# Patient Record
Sex: Male | Born: 1990 | Race: White | Hispanic: No | Marital: Single | Smoking: Former smoker
Health system: Southern US, Community
[De-identification: ages and names within clinical notes are randomized; demographics above are authoritative.]

## PROBLEM LIST (undated history)

## (undated) DIAGNOSIS — L309 Dermatitis, unspecified: Secondary | ICD-10-CM

## (undated) DIAGNOSIS — J45909 Unspecified asthma, uncomplicated: Secondary | ICD-10-CM

## (undated) HISTORY — DX: Unspecified asthma, uncomplicated: J45.909

## (undated) HISTORY — DX: Dermatitis, unspecified: L30.9

---
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
## 2015-03-27 DIAGNOSIS — L209 Atopic dermatitis, unspecified: Secondary | ICD-10-CM

## 2015-03-27 DIAGNOSIS — J4540 Moderate persistent asthma, uncomplicated: Secondary | ICD-10-CM | POA: Insufficient documentation

## 2015-04-14 ENCOUNTER — Ambulatory Visit: Payer: Self-pay | Admitting: Allergy and Immunology

## 2015-05-11 ENCOUNTER — Encounter: Payer: Self-pay | Admitting: Allergy and Immunology

## 2015-05-11 ENCOUNTER — Ambulatory Visit (INDEPENDENT_AMBULATORY_CARE_PROVIDER_SITE_OTHER): Payer: BLUE CROSS/BLUE SHIELD | Admitting: Allergy and Immunology

## 2015-05-11 VITALS — BP 112/74 | HR 76 | Temp 97.5°F | Resp 20

## 2015-05-11 DIAGNOSIS — J3089 Other allergic rhinitis: Secondary | ICD-10-CM | POA: Diagnosis not present

## 2015-05-11 DIAGNOSIS — L209 Atopic dermatitis, unspecified: Secondary | ICD-10-CM

## 2015-05-11 DIAGNOSIS — J4540 Moderate persistent asthma, uncomplicated: Secondary | ICD-10-CM | POA: Diagnosis not present

## 2015-05-11 DIAGNOSIS — T7800XA Anaphylactic reaction due to unspecified food, initial encounter: Secondary | ICD-10-CM | POA: Insufficient documentation

## 2015-05-11 DIAGNOSIS — T7800XD Anaphylactic reaction due to unspecified food, subsequent encounter: Secondary | ICD-10-CM | POA: Diagnosis not present

## 2015-05-11 MED ORDER — MOMETASONE FURO-FORMOTEROL FUM 100-5 MCG/ACT IN AERO: 2.0000 | INHALATION_SPRAY | Freq: Two times a day (BID) | RESPIRATORY_TRACT | Status: DC

## 2015-05-11 MED ORDER — LEVOCETIRIZINE DIHYDROCHLORIDE 5 MG PO TABS: 5.0000 mg | ORAL_TABLET | Freq: Every evening | ORAL | Status: DC

## 2015-05-11 NOTE — Patient Instructions (Addendum)
Moderate persistent asthma Well-controlled.  We will stepdown therapy at this time.  [facility]asone/[facility]oterol) 100/5 g, 2 inhalations via spacer device twice a day.  Discontinue Dulera 200/5 g.  Continue ProAir Respiclick every 4-6 hours as needed and 15 minutes prior to vigorous exercise.  Subjective and objective measures of pulmonary function will be followed and the treatment plan will be adjusted accordingly.   Food allergy  Continue meticulous avoidance of tree nuts and have access to epinephrine autoinjector 2 pack.  Atopic dermatitis  Continue appropriate skin care and halobetasol 0.05% or mometasone 0.1% cream sparingly to affected areas as needed.  Allergic rhinitis  Aeroallergen avoidance measures have been discussed and provided in written form.  A prescription has been provided for levocetirizine, 5 mg daily as needed.  For now, continue fluticasone nasal spray as needed.  The risks and benefits of aeroallergen immunotherapy have been discussed. The patient is motivated to initiate immunotherapy if insurance coverage is favorable. He will let us know how he would like to proceed.     Return in about 3 months (around 08/11/2015), or if symptoms worsen or fail to improve.

## 2015-05-11 NOTE — Assessment & Plan Note (Signed)
Well-controlled.  We will stepdown therapy at this time.  [facility] 100/5 g, 2 inhalations via spacer device twice a day.  Discontinue Dulera 200/5 g.  Continue ProAir Respiclick every 4-6 hours as needed and 15 minutes prior to vigorous exercise.  Subjective and objective measures of pulmonary function will be followed and the treatment plan will be adjusted accordingly.

## 2015-05-11 NOTE — Assessment & Plan Note (Signed)
   Continue meticulous avoidance of tree nuts and have access to epinephrine autoinjector 2 pack.

## 2015-05-11 NOTE — Progress Notes (Signed)
History of present illness: HPI Comments: Jon Daniel is a 24 y.o. male with asthma, allergic rhinitis, atopic dermatitis, and food allergies presents today for follow up and allergy skin testing.  He was unable to undergo skin testing during his previous visit due to an asthma exacerbation.  Please see history of present illness from 03/16/2015 for details. Jon Daniel reports that in the interval since his previous visit in August, his asthma symptoms have improved significantly and are well controlled with Dulera 200/5, 2 inhalations via spacer device twice a day.  He has only required albuterol rescue on one occasion over the past 6 weeks and denies nocturnal awakenings due to lower respiratory symptoms.  He has no specific rhinitis or eczema related complaints today.  He avoids tree nuts and has access to epinephrine autoinjector 2 pack.   Assessment and plan: Moderate persistent asthma Well-controlled.  We will stepdown therapy at this time.  [facility]ol[facility] 100/5 g, 2 inhalations via spacer device twice a day.  Discontinue Dulera 200/5 g.  Continue ProAir Respiclick every 4-6 hours as needed and 15 minutes prior to vigorous exercise.  Subjective and objective measures of pulmonary function will be followed and the treatment plan will be adjusted accordingly.   Food allergy  Continue meticulous avoidance of tree nuts and have access to epinephrine autoinjector 2 pack.  Atopic dermatitis  Continue appropriate skin care and halobetasol 0.05% or mometasone 0.1% cream sparingly to affected areas as needed.  Allergic rhinitis  Aeroallergen avoidance measures have been discussed and provided in written form.  A prescription has been provided for levocetirizine, 5 mg daily as needed.  For now, continue fluticasone nasal spray as needed.  The risks and benefits of aeroallergen immunotherapy have been discussed. The patient is  motivated to initiate immunotherapy if insurance coverage is favorable. He will let us know how he would like to proceed.     Medications ordered this encounter: Meds ordered this encounter  Medications  . mometasone-formoterol (DULERA) 100-5 MCG/ACT AERO    Sig: Inhale 2 puffs into the lungs 2 (two) times daily.    Dispense:  1 Inhaler    Refill:  5  . levocetirizine (XYZAL) 5 MG tablet    Sig: Take 1 tablet (5 mg total) by mouth every evening.    Dispense:  30 tablet    Refill:  5    Diagnositics: Percutaneous Skin Testing: Positive for grass pollen, weed pollen, tree pollen, mold, cat hair, force epithelia, and dust mite antigen. Intradermal Skin Testing: Positive to ragweed and dog epithelia. Spirometry: normal. Please see scanned spirometry results for details.     Physical examination: Blood pressure 112/74, pulse 76, temperature 97.5 F (36.4 C), temperature source Oral, resp. rate 20.  General: Alert, interactive, in no acute distress. HEENT: TMs pearly gray, turbinates markedly edematous with clear discharge, post-pharynx moderately erythematous. Neck: Supple without lymphadenopathy. Lungs: Clear to auscultation without wheezing, rhonchi or rales. CV: Normal S1, S2 without murmurs. Skin: Warm and dry, without lesions or rashes.  The following portions of the patient's history were reviewed and updated as appropriate: allergies, current medications, past family history, past medical history, past social history, past surgical history and problem list.  Outpatient medications:   Medication List       This list is accurate as of: 05/11/15  4:24 PM.  Always use your most recent med list.  BENADRYL ALLERGY PO  Take by mouth as needed.     EPIPEN 2-PAK 0.3 mg/0.3 mL Soaj injection  Generic drug:  EPINEPHrine  Inject 0.3 mg into the muscle once.     fexofenadine 180 MG tablet  Commonly known as:  ALLEGRA  Take 180 mg by mouth daily.      fluticasone 50 MCG/ACT nasal spray  Commonly known as:  FLONASE  Place 1 spray into both nostrils daily.     halobetasol 0.05 % cream  Commonly known as:  ULTRAVATE  Apply topically 2 (two) times daily.     hydrOXYzine 10 MG tablet  Commonly known as:  ATARAX/VISTARIL  Take 10 mg by mouth daily as needed.     levocetirizine 5 MG tablet  Commonly known as:  XYZAL  Take 1 tablet (5 mg total) by mouth every evening.     mometasone 0.1 % cream  Commonly known as:  ELOCON  Apply 1 application topically as needed.     mometasone-formoterol 100-5 MCG/ACT Aero  Commonly known as:  DULERA  Inhale 2 puffs into the lungs 2 (two) times daily.     MOTRIN PO  Take by mouth as needed.     PROAIR RESPICLICK 108 (90 BASE) MCG/ACT Aepb  Generic drug:  Albuterol Sulfate  Inhale 2 puffs into the lungs.        Known medication allergies: No Known Allergies  I appreciate the opportunity to take part in this Jon Daniel's care. Please do not hesitate to contact me with questions.  Sincerely,   R. Jorene Guest, MD

## 2015-05-11 NOTE — Assessment & Plan Note (Addendum)
   Continue appropriate skin care and halobetasol 0.05% or mometasone 0.1% cream sparingly to affected areas as needed.

## 2015-05-11 NOTE — Assessment & Plan Note (Addendum)
   Aeroallergen avoidance measures have been discussed and provided in written form.  A prescription has been provided for levocetirizine, 5 mg daily as needed.  For now, continue fluticasone nasal spray as needed.  The risks and benefits of aeroallergen immunotherapy have been discussed. The patient is motivated to initiate immunotherapy if insurance coverage is favorable. He will let us know how he would like to proceed.

## 2015-08-11 ENCOUNTER — Telehealth: Payer: Self-pay | Admitting: Allergy and Immunology

## 2015-08-11 ENCOUNTER — Ambulatory Visit: Payer: BLUE CROSS/BLUE SHIELD | Admitting: Allergy and Immunology

## 2015-08-11 NOTE — Telephone Encounter (Signed)
Pt's mother called today to cancel apt. She said Jon Daniel has moved to [facility] [place] and was wondering if we could Refer him to an Allergist there. pls advise

## 2015-08-11 NOTE — Telephone Encounter (Signed)
Called and spoke to patients mother and advised to contact there insurance and find out what allergist are covered in [place] then she could contact us back and we could make the referral.

## 2015-09-12 ENCOUNTER — Other Ambulatory Visit: Payer: Self-pay | Admitting: Allergy and Immunology

## 2016-04-03 ENCOUNTER — Other Ambulatory Visit: Payer: Self-pay

## 2016-04-03 DIAGNOSIS — J4540 Moderate persistent asthma, uncomplicated: Secondary | ICD-10-CM

## 2016-04-04 ENCOUNTER — Other Ambulatory Visit: Payer: Self-pay

## 2016-04-04 ENCOUNTER — Telehealth: Payer: Self-pay | Admitting: Allergy and Immunology

## 2016-04-04 DIAGNOSIS — J4540 Moderate persistent asthma, uncomplicated: Secondary | ICD-10-CM

## 2016-04-04 NOTE — Telephone Encounter (Signed)
Tried to call mom to get address of the pharmacy but their is no voicemail.

## 2016-04-04 NOTE — Telephone Encounter (Signed)
Yes, you may provide a refill of Dulera and ProAir respiclick. Thanks.

## 2016-04-04 NOTE — Telephone Encounter (Signed)
Patient's mom called and made an appt on 04/17/16, for her son, Jon Daniel [birth year] with Dr. Delorse LekPadgett. He was last seen by Dr. Nunzio CobbsBobbitt on 05/11/15. He has since moved to [facility] [place] and is going to make a trip up here for his appt. He needs his inhaler's refilled. I looked them up and see he has G[employer]erro Air Respiclick and Dulera. His pharmacy is in [place]. [facility] [address] [phone].

## 2016-04-04 NOTE — Telephone Encounter (Signed)
[facility] and this is another number for a difference [facility] in [place] [phone]. [phone] mom cell. [phone] home.

## 2016-04-04 NOTE — Telephone Encounter (Signed)
Pt hasnt been seen since 05/11/2015 is it ok to refill this medication for the patient?  Please advise

## 2016-04-05 ENCOUNTER — Telehealth: Payer: Self-pay | Admitting: Allergy and Immunology

## 2016-04-05 ENCOUNTER — Other Ambulatory Visit: Payer: Self-pay

## 2016-04-05 DIAGNOSIS — J4540 Moderate persistent asthma, uncomplicated: Secondary | ICD-10-CM

## 2016-04-05 MED ORDER — MOMETASONE FURO-FORMOTEROL FUM 100-5 MCG/ACT IN AERO: 2.0000 | INHALATION_SPRAY | Freq: Two times a day (BID) | RESPIRATORY_TRACT | 0 refills | Status: DC

## 2016-04-05 MED ORDER — ALBUTEROL SULFATE 108 (90 BASE) MCG/ACT IN AEPB: 2.0000 | INHALATION_SPRAY | RESPIRATORY_TRACT | 0 refills | Status: DC | PRN

## 2016-04-05 NOTE — Telephone Encounter (Signed)
Patient's father called checking on the refill request from yesterday for his son's inhaler. He wants to know if it will be able to be filled at all? If it is not able to be filled, he would like to be called back today so he can tell his son to go to a walk in clinic if not.

## 2016-04-05 NOTE — Telephone Encounter (Signed)
Spoke with dad and sent in rx to correct pharmacy 

## 2016-04-05 NOTE — Telephone Encounter (Signed)
Spoke with dad and sent in rx to correct pharmacy

## 2016-04-17 ENCOUNTER — Ambulatory Visit (INDEPENDENT_AMBULATORY_CARE_PROVIDER_SITE_OTHER): Payer: BLUE CROSS/BLUE SHIELD | Admitting: Allergy

## 2016-04-17 ENCOUNTER — Encounter: Payer: Self-pay | Admitting: Allergy

## 2016-04-17 ENCOUNTER — Encounter (INDEPENDENT_AMBULATORY_CARE_PROVIDER_SITE_OTHER): Payer: Self-pay

## 2016-04-17 VITALS — BP 132/90 | HR 78 | Temp 97.9°F | Resp 18 | Wt 204.4 lb

## 2016-04-17 DIAGNOSIS — L2089 Other atopic dermatitis: Secondary | ICD-10-CM | POA: Diagnosis not present

## 2016-04-17 DIAGNOSIS — J4540 Moderate persistent asthma, uncomplicated: Secondary | ICD-10-CM

## 2016-04-17 DIAGNOSIS — J301 Allergic rhinitis due to pollen: Secondary | ICD-10-CM

## 2016-04-17 DIAGNOSIS — T7800XD Anaphylactic reaction due to unspecified food, subsequent encounter: Secondary | ICD-10-CM | POA: Diagnosis not present

## 2016-04-17 MED ORDER — HALOBETASOL PROPIONATE 0.05 % EX CREA: TOPICAL_CREAM | CUTANEOUS | 3 refills | Status: DC | PRN

## 2016-04-17 MED ORDER — MOMETASONE FURO-FORMOTEROL FUM 100-5 MCG/ACT IN AERO: 2.0000 | INHALATION_SPRAY | Freq: Two times a day (BID) | RESPIRATORY_TRACT | 5 refills | Status: DC

## 2016-04-17 MED ORDER — MOMETASONE FUROATE 0.1 % EX CREA: 1.0000 "application " | TOPICAL_CREAM | CUTANEOUS | 3 refills | Status: DC | PRN

## 2016-04-17 MED ORDER — FLUTICASONE PROPIONATE 50 MCG/ACT NA SUSP: 2.0000 | Freq: Every day | NASAL | 5 refills | Status: DC

## 2016-04-17 MED ORDER — ALBUTEROL SULFATE 108 (90 BASE) MCG/ACT IN AEPB: 2.0000 | INHALATION_SPRAY | RESPIRATORY_TRACT | 1 refills | Status: DC | PRN

## 2016-04-17 NOTE — Progress Notes (Signed)
Follow-up Note  RE: Jon Daniel MRN: [id number] DOB: 30-Aug-1990 Date of Office Visit: 04/17/2016   History of present illness: Jon Daniel is a 25 y.o. male presenting today for follow-up of asthma, food allergy, eczema and allergic rhinitis.  He was last seen in our office in October 2016 by Dr. Nunzio CobbsBobbitt.  He ran out of his medications several weeks ago intake comments he get refill until his upcoming apointment.  He has been back on his medications for about a week.  He noticed a big difference in his breathing when he ran [facility].He noted that he was needing to use his albuterol almost daily. Since being back on his [facility] SleightDulera he has not needed to use his albuterol. Prior to running out of his inhalers he reports using albuterol maybe 3-4 mornings over the past year for rescue symptoms. He denies any exacerbations requiring eat or urgent care visits, oral steroids or hospitalization. He denies any nighttime awakenings.   He will use allegra as needed.  He has been having more nasal drainage and congestion.  He has Flonase that he uses as needed but has not been using it lately with his nasal symptoms.Marland Kitchen. He feels the changes in the weather has brought on his nasal symptoms.   With his food allergy continues to avoid tree nuts.  Has uptodate epipen. He has not had any accidental ingestions    With his eczema he needs refills of his Elocon and Ultravate.  His problem areas are elbow crease and decrease.He does not moisturize daily basis.  Review of systems: Review of Systems  Constitutional: Negative for chills and fever.  HENT: Positive for congestion. Negative for sore throat.   Eyes: Negative for redness.  Respiratory: Positive for cough and wheezing.   Cardiovascular: Negative for chest pain.  Gastrointestinal: Negative for nausea and vomiting.  Skin: Positive for itching and rash.  Neurological: Negative for headaches.    All other systems negative unless noted above  in HPI  Past medical/social/surgical/family history have been reviewed and are unchanged unless specifically indicated below.  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  Medication List:   Medication List       Accurate as of 04/17/16 12:38 PM. Always use your most recent med list.          BENADRYL ALLERGY PO Take by mouth as needed.   EPIPEN 2-PAK 0.3 mg/0.3 mL Soaj injection Generic drug:  EPINEPHrine Inject 0.3 mg into the muscle once.   fexofenadine 180 MG tablet Commonly known as:  ALLEGRA Take 180 mg by mouth daily.   halobetasol 0.05 % cream Commonly known as:  ULTRAVATE Apply topically 2 (two) times daily.   hydrOXYzine 10 MG tablet Commonly known as:  ATARAX/VISTARIL Take 10 mg by mouth daily as needed.   levocetirizine 5 MG tablet Commonly known as:  XYZAL Take 1 tablet (5 mg total) by mouth every evening.   mometasone 0.1 % cream Commonly known as:  ELOCON Apply 1 application topically as needed.   MOTRIN PO Take by mouth as needed.       Known medication allergies: No Known Allergies   Physical examination: Blood pressure 132/90, pulse 78, temperature 97.9 F (36.6 C), temperature source Oral, resp. rate 18, weight 204 lb 6.4 oz (92.7 kg), SpO2 95 %.  General: Alert, interactive, in no acute distress. HEENT: TMs pearly gray, turbinates moderately edematous with clear discharge, post-pharynx  non erythematous. Neck: Supple without lymphadenopathy. Lungs: Clear to auscultation without wheezing, rhonchi or rales. {no increased work of breathing. CV: Normal S1, S2 without murmurs. Abdomen: Nondistended, nontender. Skin: Dry, erythematous, excoriated patches on the Antecubital fossa and popliteal fossa bilaterally. Extremities:  No clubbing, cyanosis or edema. Neuro:   Grossly intact.  Diagnositics/Labs: Spirometry: FEV1: 3.58L  81%, FVC: 6.11L  119%, ratio consistent with Obstructive pattern  ACT  score 20  Assessment and plan:   Asthma, moderate persistent  Refill Dulera (mometasone/formoterol) 100/5 g, 2 inhalations via spacer device twice a day.   Refill ProAir Respiclick in use every 4-6 hours as needed and 15 minutes prior to vigorous exercise. Asthma control goals:  Full participation in all desired activities (may need albuterol before activity) Albuterol use two time or less a week on average (not counting use with activity) Cough interfering with sleep two time or less a month Oral steroids no more than once a year No hospitalizations   Food allergy  Continue meticulous avoidance of tree nuts and have access to epinephrine autoinjector 2 pack.  Eczema  Continue appropriate skin care and halobetasol 0.05% or mometasone 0.1% cream sparingly to affected areas as needed.  Refill today  Allergic rhinitis  Use Allegra 180 mg or Xyzal 5 mg daily as needed  For now use Flonase (fluticasone) nasal spray 2 sprays each nostril daily.  Advised to use for 1-2 weeks at a time before discontinuation  Allergen immunotherapy has been discussed at previous visits. At this time and is not conducive for his work schedule.  Follow-up in 6 months when he returns back to [place] area   I appreciate the opportunity to take part in Jon Daniel's care. Please do not hesitate to contact me with questions.  Sincerely,   Margo Aye, MD Allergy/Immunology [facility]

## 2016-04-17 NOTE — Patient Instructions (Addendum)
Asthma  Refill Dulera (mometasone/formoterol) 100/5 g, 2 inhalations via spacer device twice a day.   Refill ProAir Respiclick in use every 4-6 hours as needed and 15 minutes prior to vigorous exercise. Asthma control goals:  Full participation in all desired activities (may need albuterol before activity) Albuterol use two time or less a week on average (not counting use with activity) Cough interfering with sleep two time or less a month Oral steroids no more than once a year No hospitalizations   Food allergy  Continue meticulous avoidance of tree nuts and have access to epinephrine autoinjector 2 pack.  Eczema  Continue appropriate skin care and halobetasol 0.05% or mometasone 0.1% cream sparingly to affected areas as needed.  Refill today  Allergic rhinitis  Use Allegra 180 mg or Xyzal 5 mg daily as needed  For now used Flonase (fluticasone) nasal spray 2 sprays each nostril daily.  Advised to use for 1-2 weeks at a time before discontinuation  Follow-up in 6 months

## 2016-04-19 ENCOUNTER — Telehealth: Payer: Self-pay | Admitting: Allergy

## 2016-04-19 ENCOUNTER — Other Ambulatory Visit: Payer: Self-pay

## 2016-04-19 DIAGNOSIS — J4540 Moderate persistent asthma, uncomplicated: Secondary | ICD-10-CM

## 2016-04-19 MED ORDER — MOMETASONE FURO-FORMOTEROL FUM 100-5 MCG/ACT IN AERO: 2.0000 | INHALATION_SPRAY | Freq: Two times a day (BID) | RESPIRATORY_TRACT | 5 refills | Status: DC

## 2016-04-19 MED ORDER — ALBUTEROL SULFATE 108 (90 BASE) MCG/ACT IN AEPB: 2.0000 | INHALATION_SPRAY | RESPIRATORY_TRACT | 1 refills | Status: DC | PRN

## 2016-04-19 NOTE — Telephone Encounter (Signed)
Patient's dad called and said his son, Jon Daniel, was seen by Dr. Delorse LekPadgett on 04/17/16. Some prescriptions were called in, but two inhalers did not get called in. They are Liberty Media[employer] Air Respi click and Dulera. [facility] [place]. Store # [phone] phone # [phone], address [address] [place].

## 2016-04-19 NOTE — Telephone Encounter (Signed)
Sent in refills 

## 2016-10-30 ENCOUNTER — Ambulatory Visit: Payer: BLUE CROSS/BLUE SHIELD | Admitting: Allergy

## 2017-01-12 ENCOUNTER — Ambulatory Visit (INDEPENDENT_AMBULATORY_CARE_PROVIDER_SITE_OTHER): Payer: BLUE CROSS/BLUE SHIELD | Admitting: Allergy

## 2017-01-12 ENCOUNTER — Encounter: Payer: Self-pay | Admitting: Allergy

## 2017-01-12 VITALS — BP 110/72 | HR 107 | Temp 97.8°F | Resp 18 | Ht 67.5 in | Wt 205.8 lb

## 2017-01-12 DIAGNOSIS — J3089 Other allergic rhinitis: Secondary | ICD-10-CM | POA: Diagnosis not present

## 2017-01-12 DIAGNOSIS — Z91018 Allergy to other foods: Secondary | ICD-10-CM | POA: Diagnosis not present

## 2017-01-12 DIAGNOSIS — J4540 Moderate persistent asthma, uncomplicated: Secondary | ICD-10-CM | POA: Diagnosis not present

## 2017-01-12 DIAGNOSIS — L2089 Other atopic dermatitis: Secondary | ICD-10-CM | POA: Diagnosis not present

## 2017-01-12 MED ORDER — HALOBETASOL PROPIONATE 0.05 % EX CREA: TOPICAL_CREAM | CUTANEOUS | 3 refills | Status: DC | PRN

## 2017-01-12 MED ORDER — MOMETASONE FURO-FORMOTEROL FUM 100-5 MCG/ACT IN AERO: 2.0000 | INHALATION_SPRAY | Freq: Two times a day (BID) | RESPIRATORY_TRACT | 5 refills | Status: DC

## 2017-01-12 MED ORDER — MOMETASONE FUROATE 0.1 % EX CREA: 1.0000 "application " | TOPICAL_CREAM | CUTANEOUS | 3 refills | Status: DC | PRN

## 2017-01-12 MED ORDER — ALBUTEROL SULFATE 108 (90 BASE) MCG/ACT IN AEPB: 2.0000 | INHALATION_SPRAY | RESPIRATORY_TRACT | 1 refills | Status: DC | PRN

## 2017-01-12 MED ORDER — CRISABOROLE 2 % EX OINT: 60.0000 g | TOPICAL_OINTMENT | Freq: Two times a day (BID) | CUTANEOUS | 3 refills | Status: DC

## 2017-01-12 NOTE — Patient Instructions (Signed)
Asthma  - well controlled  Continue Dulera (mometasone/formoterol) 100/5 g, 2 inhalations via spacer device twice a day.   Continue ProAir (albuterol) use every 4-6 hours as needed for cough/wheeze/shortness of breath/chest tightness and 15 minutes prior to vigorous exercise. Let us know if you are not meeting the below asthma control goals:  Full participation in all desired activities (may need albuterol before activity) Albuterol use two time or less a week on average (not counting use with activity) Cough interfering with sleep two time or less a month Oral steroids no more than once a year No hospitalizations   Food allergy  Continue meticulous avoidance of tree nuts and have access to epinephrine autoinjector 2 pack.  Eczema  Continue appropriate skin care and halobetasol 0.05% or mometasone 0.1% cream sparingly to affected areas as needed.    Will start Eucrisa (a non-steroidal eczema cream) to use either alone or together with your steroid creams above.  Use as needed twice a day during eczema flares  Will provide with prednisone pack today -- use as directed  Dupixent (self-injectable agent for management of mod-severe eczema) discussed today.  Handout provided.  Let us know if you want to proceed with this treatment option.    Allergic rhinitis   Continue Allegra 180 mg or Xyzal 5 mg daily as needed  Continue Flonase (fluticasone) nasal spray 2 sprays each nostril daily.  Advised to use for 1-2 weeks at a time before discontinuation  Follow-up in 4-6 months or sooner if needed

## 2017-01-12 NOTE — Progress Notes (Signed)
Follow-up Note  RE: Jon GladdenSamuel T Bilbo MRN: [id number] DOB: Jul 13, 1991 Date of Office Visit: 01/12/2017   History of present illness: Jon Daniel is a 26 y.o. male presenting today for follow-up of asthma, eczema, allergic rhinitis and food allergy.  He was last seen on 04/17/2016 by myself.   He has done relatively well since last visit without any major health changes, surgeries or hospitalizations.   He [place] his [place] and reports he has no allergy symptoms there.  He is most concerned about his eczema as he returns back from [place] via [place] and [place] the humidity in [place] really flares of his eczema. He has problem areas on his back, abdomen, legs, face.   He has been using his mometasone and halobetasone daily with flares but still does not completely resolve flare.   He continues to avoid nuts and as UTD epipen.   He states his asthma has been under really good control since last visit.  He does not if he misses a dose of Dulera he may have some cough/SOB.   Otherwise when he takes his Dulera regularly he does not have to use albuterol.  He denies any nighttime awakenings, no ED/UC visits or hospitalizations.      Review of systems: Review of Systems  Constitutional: Negative for chills, fever and malaise/fatigue.  HENT: Negative for congestion, ear discharge, ear pain, nosebleeds, sinus pain, sore throat and tinnitus.   Eyes: Negative for discharge and redness.  Respiratory: Negative for cough, shortness of breath and wheezing.   Cardiovascular: Negative for chest pain.  Gastrointestinal: Negative for abdominal pain, constipation, diarrhea, heartburn, nausea and vomiting.  Musculoskeletal: Negative for joint pain and myalgias.  Skin: Positive for itching and rash.  Neurological: Negative for headaches.    All other systems negative unless noted above in HPI  Past medical/social/surgical/family history have been reviewed and are unchanged unless  specifically indicated below.  No changes  Medication List: Allergies as of 01/12/2017   No Known Allergies     Medication List       Accurate as of 01/12/17 12:02 PM. Always use your most recent med list.          Albuterol Sulfate 108 (90 Base) MCG/ACT Aepb Commonly known as:  PROAIR RESPICLICK Inhale 2 puffs into the lungs every 4 (four) hours as needed. For cough or wheezing.   BENADRYL ALLERGY PO Take by mouth as needed.   EPIPEN 2-PAK 0.3 mg/0.3 mL Soaj injection Generic drug:  EPINEPHrine Inject 0.3 mg into the muscle once.   fexofenadine 180 MG tablet Commonly known as:  ALLEGRA Take 180 mg by mouth daily.   fluticasone 50 MCG/ACT nasal spray Commonly known as:  FLONASE Place 2 sprays into both nostrils daily.   halobetasol 0.05 % cream Commonly known as:  ULTRAVATE Apply topically as needed.   hydrOXYzine 10 MG tablet Commonly known as:  ATARAX/VISTARIL Take 10 mg by mouth daily as needed.   levocetirizine 5 MG tablet Commonly known as:  XYZAL Take 1 tablet (5 mg total) by mouth every evening.   mometasone 0.1 % cream Commonly known as:  ELOCON Apply 1 application topically as needed.   mometasone-formoterol 100-5 MCG/ACT Aero Commonly known as:  DULERA Inhale 2 puffs into the lungs 2 (two) times daily.   MOTRIN PO Take by mouth as needed.       Known medication allergies: No Known Allergies   Physical examination: Blood pressure  110/72, pulse (!) 107, temperature 97.8 F (36.6 C), resp. rate 18, height 5' 7.5" (1.715 m), weight 205 lb 12.8 oz (93.4 kg), SpO2 96 %.  General: Alert, interactive, in no acute distress. HEENT: PERRLA, TMs pearly gray, turbinates minimally edematous without discharge, post-pharynx non erythematous. Neck: Supple without lymphadenopathy. Lungs: Clear to auscultation without wheezing, rhonchi or rales. {no increased work of breathing. CV: Normal S1, S2 without murmurs. Abdomen: Nondistended,  nontender. Skin: Dry, erythematous, excoriated patches on the back, abdomen, legs b/l and around perimeter of face. Extremities:  No clubbing, cyanosis or edema. Neuro:   Grossly intact.  Diagnositics/Labs:  Spirometry: FEV1: 4.67L  110%, FVC: 5.67L  115%, ratio consistent with nonobstructive pattern  Assessment and plan:   Asthma, mod persistent  - well controlled  Continue Dulera (mometasone/formoterol) 100/5 g, 2 inhalations via spacer device twice a day.   Continue ProAir (albuterol) use every 4-6 hours as needed for cough/wheeze/shortness of breath/chest tightness and 15 minutes prior to vigorous exercise. Let us know if you are not meeting the below asthma control goals:  Full participation in all desired activities (may need albuterol before activity) Albuterol use two time or less a week on average (not counting use with activity) Cough interfering with sleep two time or less a month Oral steroids no more than once a year No hospitalizations   Food allergy  Continue meticulous avoidance of tree nuts and have access to epinephrine autoinjector 2 pack.  Atopic dermatitis  Continue appropriate skin care and halobetasol 0.05% or mometasone 0.1% cream sparingly to affected areas as needed.    Will start Eucrisa (a non-steroidal eczema cream) to use either alone or together with your steroid creams above.  Use as needed twice a day during eczema flares  Will provide with prednisone pack today -- use as directed  Dupixent (self-injectable agent for management of mod-severe eczema) discussed today.  Handout provided.  Let us know if you want to proceed with this treatment option.    Allergic rhinitis   Continue Allegra 180 mg or Xyzal 5 mg daily as needed  Continue Flonase (fluticasone) nasal spray 2 sprays each nostril daily.  Advised to use for 1-2 weeks at a time before discontinuation  Follow-up in 4-6 months or sooner if needed   I appreciate the opportunity  to take part in Nichael's care. Please do not hesitate to contact me with questions.  Sincerely,   Margo Aye, MD Allergy/Immunology [facility]

## 2017-03-12 ENCOUNTER — Telehealth: Payer: Self-pay | Admitting: Allergy

## 2017-03-12 ENCOUNTER — Other Ambulatory Visit: Payer: Self-pay

## 2017-03-12 MED ORDER — EPINEPHRINE 0.3 MG/0.3ML IJ SOAJ: 0.3000 mg | Freq: Once | INTRAMUSCULAR | 0 refills | Status: AC

## 2017-03-12 NOTE — Telephone Encounter (Signed)
Patient needs a refill on EPI-PEN called into the [facility] in [place] (( should be on file))

## 2017-03-12 NOTE — Telephone Encounter (Signed)
Refill sent in

## 2017-05-11 ENCOUNTER — Ambulatory Visit (INDEPENDENT_AMBULATORY_CARE_PROVIDER_SITE_OTHER): Payer: BLUE CROSS/BLUE SHIELD | Admitting: Allergy

## 2017-05-11 ENCOUNTER — Encounter: Payer: Self-pay | Admitting: Allergy

## 2017-05-11 DIAGNOSIS — J4540 Moderate persistent asthma, uncomplicated: Secondary | ICD-10-CM

## 2017-05-11 DIAGNOSIS — L2089 Other atopic dermatitis: Secondary | ICD-10-CM | POA: Diagnosis not present

## 2017-05-11 MED ORDER — MOMETASONE FUROATE 0.1 % EX CREA: 1.0000 "application " | TOPICAL_CREAM | CUTANEOUS | 11 refills | Status: DC | PRN

## 2017-05-11 MED ORDER — CRISABOROLE 2 % EX OINT: 60.0000 g | TOPICAL_OINTMENT | Freq: Two times a day (BID) | CUTANEOUS | 11 refills | Status: DC

## 2017-05-11 MED ORDER — MOMETASONE FURO-FORMOTEROL FUM 100-5 MCG/ACT IN AERO: 2.0000 | INHALATION_SPRAY | Freq: Two times a day (BID) | RESPIRATORY_TRACT | 11 refills | Status: DC

## 2017-05-11 MED ORDER — ALBUTEROL SULFATE 108 (90 BASE) MCG/ACT IN AEPB: 2.0000 | INHALATION_SPRAY | RESPIRATORY_TRACT | 6 refills | Status: DC | PRN

## 2017-05-11 NOTE — Patient Instructions (Signed)
Asthma  - well controlled  Continue Dulera (mometasone/formoterol) 100/5 g, 2 inhalations via spacer device twice a day.   Continue ProAir (albuterol) use every 4-6 hours as needed for cough/wheeze/shortness of breath/chest tightness and 15 minutes prior to vigorous exercise. Let us know if you are not meeting the below asthma control goals:  Full participation in all desired activities (may need albuterol before activity) Albuterol use two time or less a week on average (not counting use with activity) Cough interfering with sleep two time or less a month Oral steroids no more than once a year No hospitalizations   Food allergy  Continue meticulous avoidance of tree nuts and have access to epinephrine autoinjector 2 pack.  Eczema  Continue appropriate skin care with halobetasol 0.05% or mometasone 0.1% cream sparingly to affected areas as needed for severe flares.    Continue Eucrisa (a non-steroidal eczema cream) to use either alone or together with your steroid creams above.  Use as needed twice a day during eczema flares  Dupixent (self-injectable agent for management of mod-severe eczema) discussed today.  Let us know if you want to proceed with this treatment option.    Allergic rhinitis   Allegra 180 mg or Xyzal 5 mg daily as needed  Flonase (fluticasone) nasal spray 2 sprays each nostril daily.  Advised to use for 1-2 weeks at a time before discontinuation  Follow-up in 6 months or sooner if needed

## 2017-05-11 NOTE — Progress Notes (Signed)
Follow-up Note  RE: Jon Daniel MRN: [id number] DOB: [birth year] Date of Office Visit: 05/11/2017   History of present illness: Jon Daniel is a 26 y.o. male presenting today for follow-up of eczema, asthma, food allergy and allergic rhinitis.  He was last seen in the office on January 12, 2017 by myself.  He has done well since this visit without any major changes with his health, surgeries or hospitalizations.  He states he is now traveling back and forth between here in [place] for his job.  We did have him start on Eucrisa after last visit for control of his eczema.  He states this has really helped to control his skin.  He notices that he is not needed to use his mometasone as much with use of Eucrisa.     He states his asthma is under really good control he continues to use his Dulera 2 puffs twice a day.  He has not needed to use his rescue inhaler at all since his last visit.  He denies any nighttime awakenings.  No ED or urgent care visits or oral steroid needs.    He continues to avoid tree nuts without any accidental ingestions or need for Epinephrine use.     With his allergic rhinitis has denies any symptoms and no needed for any antihistamine or flonase use.       Review of systems: Review of Systems  Constitutional: Negative for chills, fever and malaise/fatigue.  HENT: Negative for congestion, ear discharge, ear pain, nosebleeds, sinus pain, sore throat and tinnitus.   Eyes: Negative for pain, discharge and redness.  Respiratory: Negative for cough, shortness of breath and wheezing.   Cardiovascular: Negative for chest pain.  Gastrointestinal: Negative for abdominal pain, constipation, diarrhea, heartburn, nausea and vomiting.  Musculoskeletal: Negative for joint pain.  Skin: Positive for itching and rash.  Neurological: Negative for headaches.    All other systems negative unless noted above in HPI  Past medical/social/surgical/family history have been  reviewed and are unchanged unless specifically indicated below.  No changes  Medication List: Allergies as of 05/11/2017   No Known Allergies     Medication List       Accurate as of 05/11/17  5:16 PM. Always use your most recent med list.          Albuterol Sulfate 108 (90 Base) MCG/ACT Aepb Commonly known as:  PROAIR RESPICLICK Inhale 2 puffs into the lungs every 4 (four) hours as needed. For cough or wheezing.   BENADRYL ALLERGY PO Take by mouth as needed.   Crisaborole 2 % Oint Commonly known as:  EUCRISA Apply 60 g topically 2 (two) times daily. Apply twice a day as needed for eczema flares   EPINEPHrine 0.3 mg/0.3 mL Soaj injection Commonly known as:  EPI-PEN   fexofenadine 180 MG tablet Commonly known as:  ALLEGRA Take 180 mg by mouth daily.   hydrOXYzine 10 MG tablet Commonly known as:  ATARAX/VISTARIL Take 10 mg by mouth daily as needed.   levocetirizine 5 MG tablet Commonly known as:  XYZAL Take 1 tablet (5 mg total) by mouth every evening.   mometasone 0.1 % cream Commonly known as:  ELOCON Apply 1 application topically as needed.   mometasone-formoterol 100-5 MCG/ACT Aero Commonly known as:  DULERA Inhale 2 puffs into the lungs 2 (two) times daily.   MOTRIN PO Take by mouth as needed.       Known medication allergies: No Known Allergies  Physical examination: Blood pressure 134/74, pulse 80, temperature 97.8 F (36.6 C), temperature source Oral, resp. rate 18, SpO2 97 %.  General: Alert, interactive, in no acute distress. HEENT: PERRLA, TMs pearly gray, turbinates non-edematous without discharge, post-pharynx non erythematous. Neck: Supple without lymphadenopathy. Lungs: Clear to auscultation without wheezing, rhonchi or rales. {no increased work of breathing. CV: Normal S1, S2 without murmurs. Abdomen: Nondistended, nontender. Skin: Dry, erythematous, excoriated patches on the Antecubital fossa bilaterally and popliteal fossa  bilaterally. Extremities:  No clubbing, cyanosis or edema. Neuro:   Grossly intact.  Diagnositics/Labs:  Spirometry: FEV1: 4.37L  104%, FVC: 5.64L  112%, ratio consistent with nonobstructive pattern  Assessment and plan:   Asthma, mod persistent  - well controlled  Continue Dulera (mometasone/formoterol) 100/5 g, 2 inhalations via spacer device twice a day.   Continue ProAir (albuterol) use every 4-6 hours as needed for cough/wheeze/shortness of breath/chest tightness and 15 minutes prior to vigorous exercise. Let us know if you are not meeting the below asthma control goals:  Full participation in all desired activities (may need albuterol before activity) Albuterol use two time or less a week on average (not counting use with activity) Cough interfering with sleep two time or less a month Oral steroids no more than once a year No hospitalizations   Food allergy  Continue meticulous avoidance of tree nuts and have access to epinephrine autoinjector 2 pack.  Eczema  Continue appropriate skin care with halobetasol 0.05% or mometasone 0.1% cream sparingly to affected areas as needed for severe flares.    Continue Eucrisa (a non-steroidal eczema cream) to use either alone or together with your steroid creams above.  Use as needed twice a day during eczema flares  Dupixent (self-injectable agent for management of mod-severe eczema) discussed today.  Let us know if you want to proceed with this treatment option.    Allergic rhinitis   Allegra 180 mg or Xyzal 5 mg daily as needed  Flonase (fluticasone) nasal spray 2 sprays each nostril daily.  Advised to use for 1-2 weeks at a time before discontinuation  Follow-up in 6 months or sooner if needed  I appreciate the opportunity to take part in Albi's care. Please do not hesitate to contact me with questions.  Sincerely,   Margo AyeShaylar Rosaland Shiffman, MD Allergy/Immunology [facility]

## 2017-11-09 ENCOUNTER — Ambulatory Visit: Payer: BLUE CROSS/BLUE SHIELD | Admitting: Allergy

## 2017-12-14 ENCOUNTER — Encounter: Payer: Self-pay | Admitting: Allergy

## 2017-12-14 ENCOUNTER — Ambulatory Visit (INDEPENDENT_AMBULATORY_CARE_PROVIDER_SITE_OTHER): Payer: BLUE CROSS/BLUE SHIELD | Admitting: Allergy

## 2017-12-14 VITALS — BP 130/70 | HR 73 | Temp 98.2°F | Resp 18

## 2017-12-14 DIAGNOSIS — J3089 Other allergic rhinitis: Secondary | ICD-10-CM

## 2017-12-14 DIAGNOSIS — L2089 Other atopic dermatitis: Secondary | ICD-10-CM | POA: Diagnosis not present

## 2017-12-14 DIAGNOSIS — Z91018 Allergy to other foods: Secondary | ICD-10-CM | POA: Diagnosis not present

## 2017-12-14 DIAGNOSIS — J4540 Moderate persistent asthma, uncomplicated: Secondary | ICD-10-CM

## 2017-12-14 MED ORDER — BUDESONIDE-FORMOTEROL FUMARATE 160-4.5 MCG/ACT IN AERO: 2.0000 | INHALATION_SPRAY | Freq: Two times a day (BID) | RESPIRATORY_TRACT | 5 refills | Status: DC

## 2017-12-14 MED ORDER — ALBUTEROL SULFATE HFA 108 (90 BASE) MCG/ACT IN AERS: 2.0000 | INHALATION_SPRAY | RESPIRATORY_TRACT | 5 refills | Status: DC | PRN

## 2017-12-14 NOTE — Patient Instructions (Addendum)
Asthma  - well controlled   Will change Dulera to Symbicort for improved insurance coverage.  Use Symbicort  2 inhalations via spacer device twice a day.   Continue albuterol HFA use every 4-6 hours as needed for cough/wheeze/shortness of breath/chest tightness and 15 minutes prior to vigorous exercise. Let us know if you are not meeting the below asthma control goals:  Full participation in all desired activities (may need albuterol before activity) Albuterol use two time or less a week on average (not counting use with activity) Cough interfering with sleep two time or less a month Oral steroids no more than once a year No hospitalizations   Food allergy  Continue meticulous avoidance of tree nuts and have access to epinephrine autoinjector 2 pack.  Eczema  Continue appropriate skin care with halobetasol 0.05% or mometasone 0.1% cream sparingly to affected areas as needed for severe flares.    Continue Eucrisa (a non-steroidal eczema cream) to use either alone or together with your steroid creams above.  Use as needed twice a day during eczema flares  Daily moisturization   Your skin is doing well with above topical creams.  if above regimen is no longer effective consider Dupixent (self-injectable agent for management of mod-severe eczema) as a treatment option.    Allergic rhinitis   Allegra 180 mg or Xyzal 5 mg daily as needed  Flonase (fluticasone) nasal spray 2 sprays each nostril daily for nasal congestion/drainage.   Use for 1-2 weeks at a time before discontinuation  Follow-up in November 2019 or sooner if needed

## 2017-12-14 NOTE — Progress Notes (Signed)
Follow-up Note  RE: Jon Daniel MRN: [id number] DOB: [birth year] Date of Office Visit: 12/14/2017   History of present illness: Jon Daniel is a 27 y.o. male presenting today for follow-up of asthma, eczema, food allergy and allergic rhinitis.  He was last seen in the office on 05/11/17 by myself.  He reports he has been doing very well since last visit without any major health changes, surgeries or hospitalizations.  He states his asthma has been under good control and he only uses albuterol prior to exercise/activity.  He states his Elwin Sleight now has become too expensive as he no longer can use a coupon for it.  He has not required any ED/UC visits or systemic steroids.  Denies any nighttime awakenings.     With his eczema he states his skin is still doing well.  He has halobetasol or mometasone for as needed use with flares as well as [place].      He has not had any nasal or ocular allergy symptoms and thus had not required use of antihistamines or his flonase.       He continues to avoid tree nuts and has access to epinephrine device.    He states he has been between [facility] and [place] for his job and they have less trees and thus he feels his allergy symptoms are much better there than here.    Review of systems: Review of Systems  Constitutional: Negative for chills, fever and malaise/fatigue.  HENT: Negative for congestion, ear discharge, nosebleeds and sore throat.   Eyes: Negative for pain, discharge and redness.  Respiratory: Negative for cough, shortness of breath and wheezing.   Cardiovascular: Negative for chest pain.  Gastrointestinal: Negative for abdominal pain, constipation, diarrhea, nausea and vomiting.  Musculoskeletal: Negative for joint pain.  Skin: Negative for itching and rash.  Neurological: Negative for headaches.    All other systems negative unless noted above in HPI  Past medical/social/surgical/family history have been reviewed and are unchanged  unless specifically indicated below.  No changes  Medication List: Allergies as of 12/14/2017   No Known Allergies     Medication List        Accurate as of 12/14/17  4:48 PM. Always use your most recent med list.          albuterol 108 (90 Base) MCG/ACT inhaler Commonly known as:  PROVENTIL HFA Inhale 2 puffs into the lungs every 4 (four) hours as needed for wheezing or shortness of breath.   BENADRYL ALLERGY PO Take by mouth as needed.   budesonide-formoterol 160-4.5 MCG/ACT inhaler Commonly known as:  SYMBICORT Inhale 2 puffs into the lungs 2 (two) times daily.   Crisaborole 2 % Oint Commonly known as:  EUCRISA Apply 60 g topically 2 (two) times daily. Apply twice a day as needed for eczema flares   EPINEPHrine 0.3 mg/0.3 mL Soaj injection Commonly known as:  EPI-PEN   fexofenadine 180 MG tablet Commonly known as:  ALLEGRA Take 180 mg by mouth daily.   mometasone 0.1 % cream Commonly known as:  ELOCON Apply 1 application topically as needed.   mometasone-formoterol 100-5 MCG/ACT Aero Commonly known as:  DULERA Inhale 2 puffs into the lungs 2 (two) times daily.       Known medication allergies: No Known Allergies   Physical examination: Blood pressure 130/70, pulse 73, temperature 98.2 F (36.8 C), temperature source Oral, resp. rate 18, SpO2 96 %.  General: Alert, interactive, in no acute distress.  HEENT: PERRLA, TMs pearly gray, turbinates non-edematous without discharge, post-pharynx non erythematous. Neck: Supple without lymphadenopathy. Lungs: Clear to auscultation without wheezing, rhonchi or rales. {no increased work of breathing. CV: Normal S1, S2 without murmurs. Abdomen: Nondistended, nontender. Skin: Warm and dry, without lesions or rashes. Extremities:  No clubbing, cyanosis or edema. Neuro:   Grossly intact.  Diagnositics/Labs: None today  Assessment and plan: Asthma  - well controlled   Will change Dulera to Symbicort for  improved insurance coverage.  Use Symbicort  2 inhalations via spacer device twice a day.   Continue albuterol HFA use every 4-6 hours as needed for cough/wheeze/shortness of breath/chest tightness and 15 minutes prior to vigorous exercise. Let us know if you are not meeting the below asthma control goals:  Full participation in all desired activities (may need albuterol before activity) Albuterol use two time or less a week on average (not counting use with activity) Cough interfering with sleep two time or less a month Oral steroids no more than once a year No hospitalizations   Food allergy  Continue meticulous avoidance of tree nuts and have access to epinephrine autoinjector 2 pack.  Eczema  Continue appropriate skin care with halobetasol 0.05% or mometasone 0.1% cream sparingly to affected areas as needed for severe flares.    Continue Eucrisa (a non-steroidal eczema cream) to use either alone or together with your steroid creams above.  Use as needed twice a day during eczema flares  Daily moisturization   Your skin is doing well with above topical creams.  if above regimen is no longer effective consider Dupixent (self-injectable agent for management of mod-severe eczema) as a treatment option.    Allergic rhinitis   Allegra 180 mg or Xyzal 5 mg daily as needed  Flonase (fluticasone) nasal spray 2 sprays each nostril daily for nasal congestion/drainage.   Use for 1-2 weeks at a time before discontinuation  Follow-up in November 2019 or sooner if needed   I appreciate the opportunity to take part in Jon Daniel's care. Please do not hesitate to contact me with questions.  Sincerely,   Margo Aye, MD Allergy/Immunology [facility]

## 2018-05-16 ENCOUNTER — Other Ambulatory Visit: Payer: Self-pay | Admitting: Allergy

## 2018-06-07 ENCOUNTER — Ambulatory Visit (INDEPENDENT_AMBULATORY_CARE_PROVIDER_SITE_OTHER): Payer: BLUE CROSS/BLUE SHIELD | Admitting: Allergy

## 2018-06-07 ENCOUNTER — Encounter: Payer: Self-pay | Admitting: Allergy

## 2018-06-07 VITALS — BP 124/90 | HR 66 | Resp 18 | Ht 68.5 in | Wt 201.6 lb

## 2018-06-07 DIAGNOSIS — J3089 Other allergic rhinitis: Secondary | ICD-10-CM

## 2018-06-07 DIAGNOSIS — J4540 Moderate persistent asthma, uncomplicated: Secondary | ICD-10-CM | POA: Diagnosis not present

## 2018-06-07 DIAGNOSIS — L2089 Other atopic dermatitis: Secondary | ICD-10-CM | POA: Diagnosis not present

## 2018-06-07 DIAGNOSIS — T7800XD Anaphylactic reaction due to unspecified food, subsequent encounter: Secondary | ICD-10-CM

## 2018-06-07 MED ORDER — MOMETASONE FUROATE 0.1 % EX CREA: 1.0000 "application " | TOPICAL_CREAM | CUTANEOUS | 11 refills | Status: DC | PRN

## 2018-06-07 MED ORDER — EPINEPHRINE 0.3 MG/0.3ML IJ SOAJ: 0.3000 mg | Freq: Once | INTRAMUSCULAR | 2 refills | Status: AC

## 2018-06-07 MED ORDER — ALBUTEROL SULFATE HFA 108 (90 BASE) MCG/ACT IN AERS: 2.0000 | INHALATION_SPRAY | RESPIRATORY_TRACT | 2 refills | Status: DC | PRN

## 2018-06-07 MED ORDER — MOMETASONE FURO-FORMOTEROL FUM 100-5 MCG/ACT IN AERO: 2.0000 | INHALATION_SPRAY | Freq: Two times a day (BID) | RESPIRATORY_TRACT | 11 refills | Status: DC

## 2018-06-07 NOTE — Progress Notes (Signed)
Follow-up Note  RE: Jon Daniel MRN: [id number] DOB: Jul 17, 1991 Date of Office Visit: 06/07/2018   History of present illness: Jon Daniel is a 27 y.o. male presenting today for follow-up of asthma, eczema and food allergy.   He was last seen in the office on 12/14/17 by myself.  He has been doing relatively well since last visit without any major health changes, surgeries or hospitalizations.  His job has taken him to [employer], [place] which he goes between [place] and there periodically.  At last visit he did discuss that his Jon Daniel was becoming too expensive for him and we decided to try Symbicort which was covered.  He states he can tell a difference between symbicort and Jon Daniel and does feel Jon Daniel was more effective for his control.  He states on symbicort he has needed to use albuterol about 5-6 times a month which is a lot more than when he was on Jon Daniel.  He reports cough and wheeze.  He would like to go back to Jon Daniel and is ok with covering the cost to start on Jon Daniel again.  He has had any nighttime awakenings, ED/UC visits or oral steroid needs.  He states his eczema is also doing well and will use Eucrisa and either halobetasol or mometasone with flares.  This regimen has worked very well for him.  He moisturizes daily.  He continues to avoid tree nuts without any accidental ingestions or need to use his epinephrine device.  He states he did start eating a pesto and believes it may have pine nuts in it but he has done fine with this.    Review of systems: Review of Systems  Constitutional: Negative for chills, fever and malaise/fatigue.  HENT: Negative for congestion, ear discharge, ear pain, nosebleeds and sore throat.   Eyes: Negative for pain, discharge and redness.  Respiratory: Positive for cough and wheezing.   Cardiovascular: Negative for chest pain.  Gastrointestinal: Negative for abdominal pain, constipation, diarrhea, heartburn, nausea and vomiting.  Musculoskeletal:  Negative for joint pain.  Skin: Negative for itching and rash.  Neurological: Negative for headaches.    All other systems negative unless noted above in HPI  Past medical/social/surgical/family history have been reviewed and are unchanged unless specifically indicated below.  No changes  Medication List: Allergies as of 06/07/2018   No Known Allergies     Medication List        Accurate as of 06/07/18 10:58 AM. Always use your most recent med list.          albuterol 108 (90 Base) MCG/ACT inhaler Commonly known as:  PROVENTIL HFA;VENTOLIN HFA Inhale 2 puffs into the lungs every 4 (four) hours as needed for wheezing or shortness of breath.   BENADRYL ALLERGY PO Take by mouth as needed.   Crisaborole 2 % Oint Apply 60 g topically 2 (two) times daily. Apply twice a day as needed for eczema flares   EPINEPHrine 0.3 mg/0.3 mL Soaj injection Commonly known as:  EPI-PEN   fexofenadine 180 MG tablet Commonly known as:  ALLEGRA Take 180 mg by mouth daily.   mometasone 0.1 % cream Commonly known as:  ELOCON Apply 1 application topically as needed.   mometasone-formoterol 100-5 MCG/ACT Aero Commonly known as:  Jon Daniel Inhale 2 puffs into the lungs 2 (two) times daily.   SYMBICORT 160-4.5 MCG/ACT inhaler Generic drug:  budesonide-formoterol INHALE 2 PUFFS INTO THE LUNGS TWICE DAILY.       Known medication allergies:  No Known Allergies   Physical examination: Blood pressure 124/90, pulse 66, resp. rate 18, height 5' 8.5" (1.74 m), weight 201 lb 9.6 oz (91.4 kg), SpO2 96 %.  General: Alert, interactive, in no acute distress. HEENT: PERRLA, TMs pearly gray, turbinates minimally edematous without discharge, post-pharynx non erythematous. Neck: Supple without lymphadenopathy. Lungs: Clear to auscultation without wheezing, rhonchi or rales. {no increased work of breathing. CV: Normal S1, S2 without murmurs. Abdomen: Nondistended, nontender. Skin: Warm and dry,  without lesions or rashes. Extremities:  No clubbing, cyanosis or edema. Neuro:   Grossly intact.  Diagnositics/Labs:  Spirometry: FEV1: 4.61L 104%, FVC: 5.8L 108%, ratio consistent with nonobstructive pattern  Assessment and plan:   Asthma  - well controlled  Jon Daniel was more effective for you than Symbicort as far as control.  [facility] twice [facility] with spacer.   Sample provided today.    Continue albuterol HFA use every 4-6 hours as needed for cough/wheeze/shortness of breath/chest tightness and 15 minutes prior to vigorous exercise. Let us know if you are not meeting the below asthma control goals:  Full participation in all desired activities (may need albuterol before activity) Albuterol use two time or less a week on average (not counting use with activity) Cough interfering with sleep two time or less a month Oral steroids no more than once a year No hospitalizations   Food allergy  Continue meticulous avoidance of tree nuts and have access to epinephrine autoinjector 2 pack.  Eczema  Continue appropriate skin care with halobetasol 0.05% or mometasone 0.1% cream sparingly to affected areas as needed for severe flares.    Continue Eucrisa (a non-steroidal eczema cream) to use either alone or together with your steroid creams above.  Use as needed twice a day during eczema flares.  Samples provided.  Daily moisturization   Your skin is doing well with above topical creams.  If above regimen is no longer effective consider Dupixent (self-injectable agent for management of mod-severe eczema) as a treatment option.    Allergic rhinitis   Allegra 180 mg or Xyzal 5 mg daily as needed  Flonase (fluticasone) nasal spray 2 sprays each nostril daily for nasal congestion/drainage.   Use for 1-2 weeks at a time before discontinuation  Follow-up in 12 months or sooner if needed.    I appreciate the opportunity to take part in Jon Daniel's care. Please  do not hesitate to contact me with questions.  Sincerely,   Margo AyeShaylar Leyton Magoon, MD Allergy/Immunology [facility]

## 2018-06-07 NOTE — Patient Instructions (Addendum)
Asthma  - well controlled  Dulera was more effective for you than Symbicort as far as control.  [facility] twice a day with spacer.   Sample provided today.    Continue albuterol HFA use every 4-6 hours as needed for cough/wheeze/shortness of breath/chest tightness and 15 minutes prior to vigorous exercise. Let us know if you are not meeting the below asthma control goals:  Full participation in all desired activities (may need albuterol before activity) Albuterol use two time or less a week on average (not counting use with activity) Cough interfering with sleep two time or less a month Oral steroids no more than once a year No hospitalizations   Food allergy  Continue meticulous avoidance of tree nuts and have access to epinephrine autoinjector 2 pack.  Eczema  Continue appropriate skin care with halobetasol 0.05% or mometasone 0.1% cream sparingly to affected areas as needed for severe flares.    Continue Eucrisa (a non-steroidal eczema cream) to use either alone or together with your steroid creams above.  Use as needed twice a day during eczema flares.  Samples provided.  Daily moisturization   Your skin is doing well with above topical creams.  If above regimen is no longer effective consider Dupixent (self-injectable agent for management of mod-severe eczema) as a treatment option.    Allergic rhinitis   Allegra 180 mg or Xyzal 5 mg daily as needed  Flonase (fluticasone) nasal spray 2 sprays each nostril daily for nasal congestion/drainage.   Use for 1-2 weeks at a time before discontinuation  Follow-up in 12 months or sooner if needed.

## 2018-09-13 ENCOUNTER — Telehealth: Payer: Self-pay | Admitting: Allergy

## 2018-09-13 DIAGNOSIS — L2089 Other atopic dermatitis: Secondary | ICD-10-CM

## 2018-09-13 NOTE — Telephone Encounter (Signed)
Patient's father called requesting Pam Drown, be sent in for his son. Patient lives in [place] and travels frequentley, that is why dad is calling. [facility] [address] [place].

## 2018-09-16 ENCOUNTER — Other Ambulatory Visit: Payer: Self-pay | Admitting: *Deleted

## 2018-09-16 DIAGNOSIS — L2089 Other atopic dermatitis: Secondary | ICD-10-CM

## 2018-09-16 MED ORDER — CRISABOROLE 2 % EX OINT: 60.0000 g | TOPICAL_OINTMENT | Freq: Two times a day (BID) | CUTANEOUS | 5 refills | Status: DC

## 2018-09-16 NOTE — Telephone Encounter (Signed)
Jon Daniel has been sent to the [facility] on [address] in [place]. Called dad and informed. Dad verbalized understanding.

## 2018-11-22 ENCOUNTER — Telehealth: Payer: Self-pay | Admitting: Allergy

## 2018-11-22 MED ORDER — ALBUTEROL SULFATE HFA 108 (90 BASE) MCG/ACT IN AERS: 2.0000 | INHALATION_SPRAY | RESPIRATORY_TRACT | 0 refills | Status: DC | PRN

## 2018-11-22 NOTE — Telephone Encounter (Signed)
Patient needs a refill on rescue inhaler - PRO-AIR Called int [facility] on [address] in [facility]

## 2018-11-22 NOTE — Telephone Encounter (Signed)
Called and left message informing patient that medication refill has been sent in and he is due for an OV.

## 2018-12-26 ENCOUNTER — Other Ambulatory Visit: Payer: Self-pay | Admitting: *Deleted

## 2018-12-26 MED ORDER — ALBUTEROL SULFATE HFA 108 (90 BASE) MCG/ACT IN AERS: 2.0000 | INHALATION_SPRAY | RESPIRATORY_TRACT | 0 refills | Status: DC | PRN

## 2018-12-26 NOTE — Telephone Encounter (Signed)
Courtesy refill sent in needs ov for further refill

## 2019-03-07 DIAGNOSIS — R221 Localized swelling, mass and lump, neck: Secondary | ICD-10-CM | POA: Insufficient documentation

## 2019-05-23 ENCOUNTER — Ambulatory Visit: Payer: 59 | Admitting: Allergy

## 2019-05-23 ENCOUNTER — Encounter: Payer: Self-pay | Admitting: Allergy

## 2019-05-23 ENCOUNTER — Other Ambulatory Visit: Payer: Self-pay

## 2019-05-23 VITALS — BP 132/98 | HR 80 | Temp 98.0°F | Resp 16 | Ht 69.0 in | Wt 180.2 lb

## 2019-05-23 DIAGNOSIS — J4540 Moderate persistent asthma, uncomplicated: Secondary | ICD-10-CM | POA: Diagnosis not present

## 2019-05-23 DIAGNOSIS — J3089 Other allergic rhinitis: Secondary | ICD-10-CM

## 2019-05-23 DIAGNOSIS — L2089 Other atopic dermatitis: Secondary | ICD-10-CM | POA: Diagnosis not present

## 2019-05-23 DIAGNOSIS — T7800XD Anaphylactic reaction due to unspecified food, subsequent encounter: Secondary | ICD-10-CM

## 2019-05-23 MED ORDER — DULERA 100-5 MCG/ACT IN AERO: 2.0000 | INHALATION_SPRAY | Freq: Two times a day (BID) | RESPIRATORY_TRACT | 11 refills | Status: DC

## 2019-05-23 MED ORDER — MOMETASONE FUROATE 0.1 % EX CREA: 1.0000 "application " | TOPICAL_CREAM | CUTANEOUS | 11 refills | Status: DC | PRN

## 2019-05-23 MED ORDER — EUCRISA 2 % EX OINT: 60.0000 g | TOPICAL_OINTMENT | Freq: Two times a day (BID) | CUTANEOUS | 11 refills | Status: DC

## 2019-05-23 MED ORDER — ALBUTEROL SULFATE HFA 108 (90 BASE) MCG/ACT IN AERS: 2.0000 | INHALATION_SPRAY | RESPIRATORY_TRACT | 1 refills | Status: DC | PRN

## 2019-05-23 MED ORDER — EPINEPHRINE 0.3 MG/0.3ML IJ SOAJ: 0.3000 mg | Freq: Once | INTRAMUSCULAR | 2 refills | Status: AC

## 2019-05-23 MED ORDER — FLUOCINOLONE ACETONIDE SCALP 0.01 % EX OIL: TOPICAL_OIL | CUTANEOUS | 11 refills | Status: DC

## 2019-05-23 NOTE — Patient Instructions (Addendum)
Asthma  - well controlled  Continue Dulera 179mcg 2 puffs twice a day with spacer.    Continue albuterol HFA use every 4-6 hours as needed for cough/wheeze/shortness of breath/chest tightness and 15 minutes prior to vigorous exercise. Let us know if you are not meeting the below asthma control goals:  Full participation in all desired activities (may need albuterol before activity) Albuterol use two time or less a week on average (not counting use with activity) Cough interfering with sleep two time or less a month Oral steroids no more than once a year No hospitalizations   Food allergy  Continue meticulous avoidance of tree nuts and have access to epinephrine autoinjector 2 pack.  Eczema  Continue appropriate skin care with mometasone 0.1% cream sparingly to affected areas as needed    Continue Eucrisa (a non-steroidal eczema cream) to use either alone or together with your steroid creams above.  Use as needed twice a day during eczema flares.    For scalp dermatitis, Dermasmoothe scalp oil - apply to dampened scalp thin layer, massage in and cover for about 4 hours then wash out and shampoo hair.  Do this as needed 1-3 times a week.    Daily moisturization   Your skin is doing well with above topical creams.  If above regimen is no longer effective consider Dupixent (self-injectable agent for management of mod-severe eczema) as a treatment option.    Allergic rhinitis   Allegra 180 mg or Xyzal 5 mg daily as needed  Flonase (fluticasone) nasal spray 2 sprays each nostril daily for nasal congestion/drainage.   Use for 1-2 weeks at a time before discontinuation as needed  Follow-up in 12 months or sooner if needed.

## 2019-05-23 NOTE — Progress Notes (Signed)
Follow-up Note  RE: Jon Daniel MRN: [id number] DOB: 03/17/1991 Date of Office Visit: 05/23/2019   History of present illness: Jon Daniel is a 28 y.o. male presenting today for follow-up of eczema, asthma and allergic rhinitis.  He was last seen in the office on 06/07/18 by myself.  He has done well over past year without any major health changes, surgeries or hospitalizations.  He states that his eczema continues to be well controlled with use of Mometasone and Eucrisa.  He moisturizes daily.  He does state that he has been having an itchy scalp and uses head and shoulders shampoo.   He states his asthma has been under good control.  He continues to use Dulera 2 puffs twice a day with spacer. He has not had any flares or need for ED/UC visits or hospitalizations or systemic steroid needs.  He states he may miss his Dulera on occasion and these days he may need to use his albuterol.   He does use albuterol prior to activity.   He states he has not had any allergic rhinitis symptoms and thus has not needed to use any antihistamines or nasal spray.    He got engaged in October!  He is planning to get flu vaccine with his fiance soon.    Review of systems: Review of Systems  Constitutional: Negative for chills, fever and malaise/fatigue.  HENT: Negative for congestion, ear discharge, nosebleeds, sinus pain and sore throat.   Eyes: Negative for pain, discharge and redness.  Respiratory: Negative.   Cardiovascular: Negative.   Gastrointestinal: Negative.   Musculoskeletal: Negative.   Skin: Positive for itching and rash.  Neurological: Negative.     All other systems negative unless noted above in HPI  Past medical/social/surgical/family history have been reviewed and are unchanged unless specifically indicated below.  No changes  Medication List: Current Outpatient Medications  Medication Sig Dispense Refill  . albuterol (PROVENTIL HFA) 108 (90 Base) MCG/ACT  inhaler Inhale 2 puffs into the lungs every 4 (four) hours as needed for wheezing or shortness of breath. 1 Inhaler 0  . Crisaborole (EUCRISA) 2 % OINT Apply 60 g topically 2 (two) times daily. Apply twice a day as needed for eczema flares 1 Tube 5  . DiphenhydrAMINE HCl (BENADRYL ALLERGY PO) Take by mouth as needed.    . mometasone (ELOCON) 0.1 % cream Apply 1 application topically as needed. 45 g 11  . mometasone-formoterol (DULERA) 100-5 MCG/ACT AERO Inhale 2 puffs into the lungs 2 (two) times daily. 1 Inhaler 11   No current facility-administered medications for this visit.      Known medication allergies: No Known Allergies   Physical examination: Blood pressure (!) 132/98, pulse 80, temperature 98 F (36.7 C), temperature source Temporal, resp. rate 16, height 5\' 9"  (1.753 m), weight 180 lb 3.2 oz (81.7 kg), SpO2 98 %.  General: Alert, interactive, in no acute distress. HEENT: PERRLA, TMs pearly gray, turbinates non-edematous without discharge, post-pharynx non erythematous. Neck: Supple without lymphadenopathy. Lungs: Clear to auscultation without wheezing, rhonchi or rales. {no increased work of breathing. CV: Normal S1, S2 without murmurs. Abdomen: Nondistended, nontender. Skin: scalp with mild flakiness throughout. Extremities:  No clubbing, cyanosis or edema. Neuro:   Grossly intact.  Diagnositics/Labs:  Spirometry: FEV1: 5.04L 114%, FVC: 6.24L 117%, ratio consistent with nonobstructive pattern  Assessment and plan:   Asthma  - well controlled  Continue Dulera 2 puffs twice a day with spacer.  Continue albuterol HFA use every 4-6 hours as needed for cough/wheeze/shortness of breath/chest tightness and 15 minutes prior to vigorous exercise. Let us know if you are not meeting the below asthma control goals:  Full participation in all desired activities (may need albuterol before activity) Albuterol use two time or less a week on average (not counting use with  activity) Cough interfering with sleep two time or less a month Oral steroids no more than once a year No hospitalizations   Food allergy  Continue meticulous avoidance of tree nuts and have access to epinephrine autoinjector 2 pack.  Eczema  Continue appropriate skin care with mometasone 0.1% cream sparingly to affected areas as needed    Continue Eucrisa (a non-steroidal eczema cream) to use either alone or together with your steroid creams above.  Use as needed twice a day during eczema flares.    For scalp dermatitis, Dermasmoothe scalp oil - apply to dampened scalp thin layer, massage in and cover for about 4 hours then wash out and shampoo hair.  Do this as needed 1-3 times a week.    Daily moisturization   Your skin is doing well with above topical creams.  If above regimen is no longer effective consider Dupixent (self-injectable agent for management of mod-severe eczema) as a treatment option.    Allergic rhinitis   Allegra 180 mg or Xyzal 5 mg daily as needed  Flonase (fluticasone) nasal spray 2 sprays each nostril daily for nasal congestion/drainage.   Use for 1-2 weeks at a time before discontinuation as needed  Plans to get flu vaccine soon.   Follow-up in 12 months or sooner if needed.     I appreciate the opportunity to take part in Elie's care. Please do not hesitate to contact me with questions.  Sincerely,   Prudy Feeler, MD Allergy/Immunology [facility]

## 2019-06-30 ENCOUNTER — Other Ambulatory Visit: Payer: Self-pay | Admitting: *Deleted

## 2019-06-30 MED ORDER — ALBUTEROL SULFATE HFA 108 (90 BASE) MCG/ACT IN AERS: 2.0000 | INHALATION_SPRAY | RESPIRATORY_TRACT | 1 refills | Status: DC | PRN

## 2020-05-18 ENCOUNTER — Telehealth: Payer: Self-pay | Admitting: *Deleted

## 2020-05-18 DIAGNOSIS — J4540 Moderate persistent asthma, uncomplicated: Secondary | ICD-10-CM

## 2020-05-18 MED ORDER — MOMETASONE FURO-FORMOTEROL FUM 100-5 MCG/ACT IN AERO: 2.0000 | INHALATION_SPRAY | Freq: Two times a day (BID) | RESPIRATORY_TRACT | 0 refills | Status: DC

## 2020-05-18 NOTE — Telephone Encounter (Signed)
Patient called the office and states he is out of state in [place] for his honeymoon.  He needs a refill on his Dulera 100 mcg.  Patient would like a courtesy refill until he has his appointment with Dr. Delorse Lek on 05/28/20.  Patient is aware he has to keep his follow up appointment and can not get any other refills until seen.  Patient voiced understanding.  [facility] [place] on [address]

## 2020-05-19 ENCOUNTER — Telehealth: Payer: Self-pay | Admitting: Allergy

## 2020-05-19 NOTE — Telephone Encounter (Signed)
Patient was calling back about a refill that he had ask for.

## 2020-05-19 NOTE — Telephone Encounter (Signed)
Called patient and advised that [facility] [place]. Patient verbalized understanding.

## 2020-05-20 ENCOUNTER — Other Ambulatory Visit: Payer: Self-pay

## 2020-05-20 MED ORDER — DULERA 200-5 MCG/ACT IN AERO: 1.0000 | INHALATION_SPRAY | Freq: Two times a day (BID) | RESPIRATORY_TRACT | 1 refills | Status: DC

## 2020-05-20 NOTE — Telephone Encounter (Signed)
Okay I sent in Dulera 1 puff twice a day.

## 2020-05-20 NOTE — Telephone Encounter (Signed)
I called patient and he states the pharmacy was able to give him the Dulera 100. Patient went ahead and picked up inhaler

## 2020-05-20 NOTE — Telephone Encounter (Signed)
Received a fax from the [facility] in [place] that the [facility] 100 is on backorder but that the 200 strength is available. Patient is on his honeymoon in [place] and needed his Dulera while there. Please advise since Dr. Delorse Lek is out of office.

## 2020-05-21 ENCOUNTER — Ambulatory Visit: Payer: 59 | Admitting: Allergy

## 2020-05-28 ENCOUNTER — Ambulatory Visit: Payer: 59 | Admitting: Allergy

## 2020-07-21 ENCOUNTER — Ambulatory Visit: Payer: 59 | Admitting: Allergy

## 2020-07-21 ENCOUNTER — Encounter: Payer: Self-pay | Admitting: Allergy

## 2020-07-21 ENCOUNTER — Other Ambulatory Visit: Payer: Self-pay

## 2020-07-21 VITALS — BP 124/72 | HR 71 | Temp 97.3°F | Resp 18 | Ht 69.5 in | Wt 200.0 lb

## 2020-07-21 DIAGNOSIS — T7800XD Anaphylactic reaction due to unspecified food, subsequent encounter: Secondary | ICD-10-CM | POA: Diagnosis not present

## 2020-07-21 DIAGNOSIS — J3089 Other allergic rhinitis: Secondary | ICD-10-CM | POA: Diagnosis not present

## 2020-07-21 DIAGNOSIS — J4540 Moderate persistent asthma, uncomplicated: Secondary | ICD-10-CM

## 2020-07-21 DIAGNOSIS — L2089 Other atopic dermatitis: Secondary | ICD-10-CM

## 2020-07-21 MED ORDER — MOMETASONE FUROATE 0.1 % EX CREA: 1.0000 "application " | TOPICAL_CREAM | CUTANEOUS | 11 refills | Status: DC | PRN

## 2020-07-21 MED ORDER — EUCRISA 2 % EX OINT: 60.0000 g | TOPICAL_OINTMENT | Freq: Two times a day (BID) | CUTANEOUS | 11 refills | Status: DC

## 2020-07-21 MED ORDER — ALBUTEROL SULFATE HFA 108 (90 BASE) MCG/ACT IN AERS: 2.0000 | INHALATION_SPRAY | RESPIRATORY_TRACT | 2 refills | Status: DC | PRN

## 2020-07-21 MED ORDER — DULERA 100-5 MCG/ACT IN AERO: 2.0000 | INHALATION_SPRAY | Freq: Two times a day (BID) | RESPIRATORY_TRACT | 11 refills | Status: DC

## 2020-07-21 NOTE — Progress Notes (Signed)
Follow-up Note  RE: Jon Daniel MRN: [id number] DOB: [birth year] Date of Office Visit: 07/21/2020   History of present illness: Jon Daniel is a 30 y.o. male presenting today for follow-up of asthma, food allergy, eczema and allergic rhinitis.  He was last seen in the office on 05/23/2019 by myself.  He has had a big year; he got married and bought a house in [place].  He states his asthma has been under good control.  With his moving he did not realize he was running low on his Dulera 100 mcg in the pharmacy did not have any in stock in this he did take Dulera 204.  Tylenol waiting for his 100 mcg inhaler.  He is back on the Dulera 100 mcg taking 2 puffs twice a day with a spacer.  His albuterol use is quite infrequent and states he has not used it in the past 2 months at least.  Denies any nighttime awakenings and has not required any ED or urgent care visits or any systemic steroid needs. He states his eczema is also doing quite well with his topical therapies of mometasone and Eucrisa.  He continues to moisturize daily after bathing. He continues to avoid tree nuts and does have access to an epinephrine device which needs to be updated.  He has not had any accidental ingestions. He states he will use Allegra as needed for allergy symptom control once in a while.  He is that he needed to use the Allegra several weeks ago with the changes in the weather.     Review of systems: Review of Systems  Constitutional: Negative.   HENT: Negative.   Eyes: Negative.   Respiratory: Negative.   Cardiovascular: Negative.   Gastrointestinal: Negative.   Musculoskeletal: Negative.   Skin: Negative.   Neurological: Negative.     All other systems negative unless noted above in HPI  Past medical/social/surgical/family history have been reviewed and are unchanged unless specifically indicated below.  No changes  Medication List: Current Outpatient Medications  Medication Sig Dispense  Refill  . albuterol (PROVENTIL HFA) 108 (90 Base) MCG/ACT inhaler Inhale 2 puffs into the lungs every 4 (four) hours as needed. 18 g 1  . Crisaborole (EUCRISA) 2 % OINT Apply 60 g topically 2 (two) times daily. Apply twice a day as needed for eczema flares 100 g 11  . DiphenhydrAMINE HCl (BENADRYL ALLERGY PO) Take by mouth as needed.    . Fluocinolone Acetonide Scalp (DERMA-SMOOTHE/FS SCALP) 0.01 % OIL Apply to wet or dampened scalp, cover with a shower cap and leave on for at least 4 hours then wash hair thoroughly. You may use shampoo to wash your hair afterwards. 118.28 mL 11  . mometasone (ELOCON) 0.1 % cream Apply 1 application topically as needed. 50 g 11  . mometasone-formoterol (DULERA) 100-5 MCG/ACT AERO Inhale 2 puffs into the lungs 2 (two) times daily. 13 g 11   No current facility-administered medications for this visit.     Known medication allergies: No Known Allergies   Physical examination: Blood pressure 124/72, pulse 71, temperature (!) 97.3 F (36.3 C), temperature source Temporal, resp. rate 18, height 5' 9.5" (1.765 m), weight 200 lb (90.7 kg), SpO2 95 %.  General: Alert, interactive, in no acute distress. HEENT: PERRLA, TMs pearly gray, turbinates non-edematous without discharge, post-pharynx non erythematous. Neck: Supple without lymphadenopathy. Lungs: Clear to auscultation without wheezing, rhonchi or rales. {no increased work of breathing. CV: Normal S1, S2 without murmurs.  Abdomen: Nondistended, nontender. Skin: Warm and dry, without lesions or rashes. Extremities:  No clubbing, cyanosis or edema. Neuro:   Grossly intact.  Diagnositics/Labs: None today  Assessment and plan:   Asthma  - well controlled  Continue Dulera 2 puffs twice a day with spacer.    Continue albuterol HFA use every 4-6 hours as needed for cough/wheeze/shortness of breath/chest tightness and 15 minutes prior to vigorous exercise. Let us know if you are not meeting the below  asthma control goals:  Full participation in all desired activities (may need albuterol before activity) Albuterol use two time or less a week on average (not counting use with activity) Cough interfering with sleep two time or less a month Oral steroids no more than once a year No hospitalizations   Food allergy  Continue meticulous avoidance of tree nuts and have access to epinephrine autoinjector 2 pack.  Eczema  Continue appropriate skin care with mometasone 0.1% cream sparingly to affected areas as needed    Continue Eucrisa (a non-steroidal eczema cream) to use either alone or together with your steroid creams above.  Use as needed twice a day during eczema flares.    For scalp dermatitis, Dermasmoothe scalp oil - apply to dampened scalp thin layer, massage in and cover for about 4 hours then wash out and shampoo hair.  Do this as needed 1-3 times a week.    Daily moisturization   Your skin is doing well with above topical creams.  If above regimen is no longer effective consider Dupixent (self-injectable agent for management of mod-severe eczema) as a treatment option.    Allergic rhinitis   Allegra 180 mg or Xyzal 5 mg daily as needed  Follow-up in 12 months or sooner if needed.   I appreciate the opportunity to take part in Jon Daniel's care. Please do not hesitate to contact me with questions.  Sincerely,   Margo Aye, MD Allergy/Immunology [facility]

## 2020-07-21 NOTE — Patient Instructions (Signed)
Asthma  - well controlled  Continue Dulera 2 puffs twice a day with spacer.    Continue albuterol HFA use every 4-6 hours as needed for cough/wheeze/shortness of breath/chest tightness and 15 minutes prior to vigorous exercise. Let us know if you are not meeting the below asthma control goals:  Full participation in all desired activities (may need albuterol before activity) Albuterol use two time or less a week on average (not counting use with activity) Cough interfering with sleep two time or less a month Oral steroids no more than once a year No hospitalizations   Food allergy  Continue meticulous avoidance of tree nuts and have access to epinephrine autoinjector 2 pack.  Eczema  Continue appropriate skin care with mometasone 0.1% cream sparingly to affected areas as needed    Continue Eucrisa (a non-steroidal eczema cream) to use either alone or together with your steroid creams above.  Use as needed twice a day during eczema flares.    For scalp dermatitis, Dermasmoothe scalp oil - apply to dampened scalp thin layer, massage in and cover for about 4 hours then wash out and shampoo hair.  Do this as needed 1-3 times a week.    Daily moisturization   Your skin is doing well with above topical creams.  If above regimen is no longer effective consider Dupixent (self-injectable agent for management of mod-severe eczema) as a treatment option.    Allergic rhinitis   Allegra 180 mg or Xyzal 5 mg daily as needed  Follow-up in 12 months or sooner if needed.

## 2020-08-18 ENCOUNTER — Telehealth: Payer: Self-pay | Admitting: Allergy

## 2020-08-18 NOTE — Telephone Encounter (Signed)
Spoke with patient's pharmacy in [place] Eucisa is needing a PA so the other medication will be filled. Pam Drown is pending for approval

## 2020-08-18 NOTE — Telephone Encounter (Signed)
Patient called and he never pick up rx at pharmacy and needs for you to send them back to pharmacy dulera, albuterol, epi-pen, eucrisa, mometasone-elocon. [facility] in [place] [phone].

## 2020-08-18 NOTE — Telephone Encounter (Signed)
Will need update insurance information from patient including his BIN, PCN, Group number and ID number to initiate PA for Eucrisa. Patient will call back to provide that information.

## 2020-08-18 NOTE — Telephone Encounter (Signed)
Spoke with patient and advise him and his parents that medications were sent to the [facility] in [place] listed in his chart. Patient and parents verbalized understanding and will call pharmacy to see if they could get prescription filled.

## 2020-08-20 NOTE — Telephone Encounter (Signed)
Jon Daniel is not covered by patient's insurance plan. They did not give any other alternatives. Would you like to try a different one?

## 2020-08-20 NOTE — Telephone Encounter (Signed)
Patient is going to reach out to the pharmacy and find out the cost for no insurance. He did say in the meantime he has samples and will let us know if he needs more.

## 2020-08-20 NOTE — Telephone Encounter (Signed)
Prescription coverage info: ID [id number] BIN [id number] PCN [id number] GRP [id number]

## 2020-08-20 NOTE — Telephone Encounter (Signed)
Im pretty sure this has been an issue in the past regarding Eucrisa so we usually load him up with samples to use.    He can let us know if he runs of samples and wants a prescription filled for another non-steroidal agent

## 2020-10-15 ENCOUNTER — Other Ambulatory Visit: Payer: Self-pay | Admitting: *Deleted

## 2020-10-15 ENCOUNTER — Telehealth: Payer: Self-pay | Admitting: Allergy

## 2020-10-15 DIAGNOSIS — J4540 Moderate persistent asthma, uncomplicated: Secondary | ICD-10-CM

## 2020-10-15 MED ORDER — DULERA 100-5 MCG/ACT IN AERO: 2.0000 | INHALATION_SPRAY | Freq: Two times a day (BID) | RESPIRATORY_TRACT | 11 refills | Status: DC

## 2020-10-15 NOTE — Telephone Encounter (Signed)
Patient states he had the [facility] on hold for a while at [facility] on [address] in [place], but now they are saying they do not have the prescription. Patient would like a new prescription sent to [facility] at [address] [place], [address].  Please advise.

## 2020-10-15 NOTE — Telephone Encounter (Signed)
New prescription has been sent in. Called patient and advised. Patient verbalized understanding.  

## 2020-10-19 NOTE — Telephone Encounter (Signed)
Pt called- he can not get his Dulera at [facility] [place] because the pharmacy says it is being refilled too soon. I called the [facility] and they stated the same thing, they said it was filled at another pharmacy but not a [facility]. Pt says he has not filled this rx in a year. I am unsure what to do at this point. Pt would like a return call on his cell.

## 2020-10-20 NOTE — Telephone Encounter (Signed)
I called the [facility] in [place] and cancelled the [facility] script that was sent in by mistake. I then spoke with the [facility] in [place] and I was told it would be ready around 1:30 today and would be $25 copay. Pt was informed of this.

## 2021-01-12 ENCOUNTER — Telehealth: Payer: 59

## 2021-01-12 ENCOUNTER — Other Ambulatory Visit: Payer: Self-pay | Admitting: Allergy

## 2021-01-12 MED ORDER — PAXLOVID 20 X 150 MG & 10 X 100MG PO TBPK: 3.0000 | ORAL_TABLET | Freq: Two times a day (BID) | ORAL | 0 refills | Status: AC

## 2021-01-12 NOTE — Telephone Encounter (Signed)
Pt tested positive this morning to covid and has symptoms of sore throat, runny nose and seeing spots with a headache. He was wondering if we could prescribe him paxlodid? Please advise

## 2021-01-12 NOTE — Telephone Encounter (Signed)
Pt does not have a hx of kidney disease and confirmed pharmacy with pt Dr Lenard Galloway sent in rx for pt pt understood

## 2021-01-12 NOTE — Progress Notes (Signed)
Pt called and states he tested positive to covid this morning.  Symptoms include sore throat, runny nose and headache.  He would like a prescription of paxlovid.   Sent paxlovid prescription to his pharmacy.  He has no history of kidney disease.

## 2021-07-27 ENCOUNTER — Ambulatory Visit: Payer: 59 | Admitting: Allergy

## 2021-07-28 ENCOUNTER — Other Ambulatory Visit: Payer: Self-pay | Admitting: *Deleted

## 2021-07-28 MED ORDER — ALBUTEROL SULFATE HFA 108 (90 BASE) MCG/ACT IN AERS
2.0000 | INHALATION_SPRAY | RESPIRATORY_TRACT | 1 refills | Status: DC | PRN
Start: 2021-07-28 — End: 2022-10-20

## 2021-10-20 ENCOUNTER — Ambulatory Visit: Payer: 59 | Admitting: Allergy

## 2021-10-20 ENCOUNTER — Encounter: Payer: Self-pay | Admitting: Allergy

## 2021-10-20 VITALS — BP 124/78 | HR 71 | Temp 98.3°F | Resp 16 | Ht 69.0 in | Wt 187.0 lb

## 2021-10-20 DIAGNOSIS — J3089 Other allergic rhinitis: Secondary | ICD-10-CM

## 2021-10-20 DIAGNOSIS — L2089 Other atopic dermatitis: Secondary | ICD-10-CM | POA: Diagnosis not present

## 2021-10-20 DIAGNOSIS — J4540 Moderate persistent asthma, uncomplicated: Secondary | ICD-10-CM

## 2021-10-20 DIAGNOSIS — T7800XD Anaphylactic reaction due to unspecified food, subsequent encounter: Secondary | ICD-10-CM

## 2021-10-20 MED ORDER — DULERA 100-5 MCG/ACT IN AERO: 2.0000 | INHALATION_SPRAY | Freq: Two times a day (BID) | RESPIRATORY_TRACT | 11 refills | Status: DC

## 2021-10-20 MED ORDER — MOMETASONE FUROATE 0.1 % EX CREA: 1.0000 "application " | TOPICAL_CREAM | CUTANEOUS | 11 refills | Status: DC | PRN

## 2021-10-20 MED ORDER — EUCRISA 2 % EX OINT: 60.0000 g | TOPICAL_OINTMENT | Freq: Two times a day (BID) | CUTANEOUS | 11 refills | Status: DC

## 2021-10-20 NOTE — Patient Instructions (Signed)
Asthma  - well controlled ?Continue Dulera 2 puffs twice a day with spacer.   ?Continue albuterol HFA use every 4-6 hours as needed for cough/wheeze/shortness of breath/chest tightness and 15 minutes prior to vigorous exercise. ?Let us know if you are not meeting the below asthma control goals:  ?Full participation in all desired activities (may need albuterol before activity) ?Albuterol use two time or less a week on average (not counting use with activity) ?Cough interfering with sleep two time or less a month ?Oral steroids no more than once a year ?No hospitalizations ?  ?  ?Food allergy ?Continue meticulous avoidance of tree nuts and have access to epinephrine autoinjector 2 pack. ?  ?Eczema ?Continue appropriate skin care with mometasone 0.1% cream sparingly to affected areas as needed   ?Continue Eucrisa (a non-steroidal eczema cream) to use either alone or together with your steroid creams above.  Use as needed twice a day during eczema flares.   ?Daily moisturization ?We did discuss the new eczema therapies available if topical therapies become less effective which includes Dupixent (as injectable) and Rinvoq or Cibinqo (as oral pill therapies) ?  ?Allergic rhinitis  ?Allegra 180 mg or Xyzal 5 mg daily as needed ?At this time does not warrant immunotherapy (allergy shots).  If you have symptoms and are requiring allergy medications then may benefit from allergy shots.  Would recommend repeat testing (can be bloodwork) prior to initiating allergy shots if needed ? ?Follow-up in 12 months or sooner if needed.   ? ?

## 2021-10-20 NOTE — Progress Notes (Signed)
? ? ?Follow-up Note ? ?RE: LYNDAL REGGIO MRN: [id number] DOB: 06-23-1991 ?Date of Office Visit: 10/20/2021 ? ? ?History of present illness: ?Jon Daniel is a 31 y.o. male presenting today for follow-up of asthma, food allergy, eczema and allergic rhinitis.  He was last seen in the office on 07/21/2020 by myself.  He has had a good year without any major health changes, surgeries or hospitalizations.  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  He and his wife still live in [place].  They are thinking about getting a dog maybe.  He is curious if he should do allergy immunotherapy for this.  At this time he is is not noting any allergy symptoms.  He has not needed to use any antihistamines or other allergy based medications.  His last allergy testing was in 2016. ?His asthma he states this has been well controlled with use of Dulera 2 puffs twice a day.  He uses albuterol maybe once a month if that.  He has not had any ED or urgent care visits or any systemic steroid use. ?He continues to avoid tree nuts without any accidental ingestions or need for his epinephrine device.  He does eat peanut butter containing products. ?His eczema to has been under good control.  With use of the mometasone and Eucrisa for flares and daily moisturization he keeps things under control.  He did use the Derma-Smoothe scalp oil after the last visit and says after about to use this his scalp dermatitis acted up.  He has not had any issues with it since. ? ?Review of systems: ?Review of Systems  ?Constitutional: Negative.   ?HENT: Negative.    ?Eyes: Negative.   ?Respiratory: Negative.    ?Cardiovascular: Negative.   ?Musculoskeletal: Negative.   ?Skin: Negative.   ?Allergic/Immunologic: Negative.   ?Neurological: Negative.    ? ?All other systems negative unless noted above in HPI ? ?Past medical/social/surgical/family history have been reviewed and are unchanged  unless specifically indicated below. ? ?No changes ? ?Medication List: ?Current Outpatient Medications  ?Medication Sig Dispense Refill  ? albuterol (PROVENTIL HFA) 108 (90 Base) MCG/ACT inhaler Inhale 2 puffs into the lungs every 4 (four) hours as needed. 54 g 1  ? DiphenhydrAMINE HCl (BENADRYL ALLERGY PO) Take by mouth as needed.    ? EUCRISA 2 % OINT Apply 60 g topically 2 (two) times daily. Apply twice a day as needed for eczema flares 100 g 11  ? Fluocinolone Acetonide Scalp (DERMA-SMOOTHE/FS SCALP) 0.01 % OIL Apply to wet or dampened scalp, cover with a shower cap and leave on for at least 4 hours then wash hair thoroughly. You may use shampoo to wash your hair afterwards. (Patient not taking: Reported on 10/20/2021) 118.28 mL 11  ? mometasone (ELOCON) 0.1 % cream Apply 1 application. topically as needed. 50 g 11  ? mometasone-formoterol (DULERA) 100-5 MCG/ACT AERO Inhale 2 puffs into the lungs 2 (two) times daily. 13 g 11  ? ?No current facility-administered medications for this visit.  ?  ? ?Known medication allergies: ?No Known Allergies ? ? ?Physical examination: ?Blood pressure 124/78, pulse 71, temperature 98.3 ?F (36.8 ?C), resp. rate 16, height 5\' 9"  (1.753 m), weight 187 lb (84.8 kg), SpO2 95 %. ? ?General: Alert, interactive, in no acute distress. ?HEENT: PERRLA, TMs pearly gray, turbinates non-edematous without discharge, post-pharynx non erythematous. ?Neck: Supple without lymphadenopathy. ?  Lungs: Clear to auscultation without wheezing, rhonchi or rales. {no increased work of breathing. ?CV: Normal S1, S2 without murmurs. ?Abdomen: Nondistended, nontender. ?Skin: Warm and dry, without lesions or rashes. ?Extremities:  No clubbing, cyanosis or edema. ?Neuro:   Grossly intact. ? ?Diagnositics/Labs: ? ?Spirometry: FEV1: 4.05L 92%, FVC: 5.36L 102%, ratio consistent with nonobstructive pattern ? ?Assessment and plan: ?  ?Asthma  - well controlled ?Continue Dulera 2 puffs twice a day with spacer.    ?Continue albuterol HFA use every 4-6 hours as needed for cough/wheeze/shortness of breath/chest tightness and 15 minutes prior to vigorous exercise. ?Let us know if you are not meeting the below asthma control goals:  ?Full participation in all desired activities (may need albuterol before activity) ?Albuterol use two time or less a week on average (not counting use with activity) ?Cough interfering with sleep two time or less a month ?Oral steroids no more than once a year ?No hospitalizations ?  ?  ?Food allergy ?Continue meticulous avoidance of tree nuts and have access to epinephrine autoinjector 2 pack. ?  ?Eczema ?Continue appropriate skin care with mometasone 0.1% cream sparingly to affected areas as needed   ?Continue Eucrisa (a non-steroidal eczema cream) to use either alone or together with your steroid creams above.  Use as needed twice a day during eczema flares.   ?Daily moisturization ?We did discuss the new eczema therapies available if topical therapies become less effective which includes Dupixent (as injectable) and Rinvoq or Cibinqo (as oral pill therapies) ?  ?Allergic rhinitis  ?Allegra 180 mg or Xyzal 5 mg daily as needed ?At this time does not warrant immunotherapy (allergy shots).  If you have symptoms and are requiring allergy medications then may benefit from allergy shots.  Would recommend repeat testing (can be bloodwork) prior to initiating allergy shots if needed ? ?Follow-up in 12 months or sooner if needed.   ? ?I appreciate the opportunity to take part in Camren's care. Please do not hesitate to contact me with questions. ? ?Sincerely, ? ? ?Margo Aye, MD ?Allergy/Immunology ?[facility] ? ? ?

## 2021-10-24 ENCOUNTER — Other Ambulatory Visit: Payer: Self-pay | Admitting: *Deleted

## 2021-10-24 MED ORDER — EUCRISA 2 % EX OINT
1.0000 | TOPICAL_OINTMENT | Freq: Two times a day (BID) | CUTANEOUS | 1 refills | Status: DC | PRN
Start: 2021-10-24 — End: 2022-10-20

## 2021-10-26 ENCOUNTER — Other Ambulatory Visit: Payer: Self-pay | Admitting: *Deleted

## 2022-10-20 ENCOUNTER — Encounter: Payer: Self-pay | Admitting: Allergy

## 2022-10-20 ENCOUNTER — Other Ambulatory Visit: Payer: Self-pay

## 2022-10-20 ENCOUNTER — Telehealth: Payer: Self-pay

## 2022-10-20 ENCOUNTER — Ambulatory Visit: Payer: 59 | Admitting: Allergy

## 2022-10-20 VITALS — BP 142/98 | HR 84 | Temp 98.7°F | Resp 16 | Wt 196.0 lb

## 2022-10-20 DIAGNOSIS — T7800XD Anaphylactic reaction due to unspecified food, subsequent encounter: Secondary | ICD-10-CM

## 2022-10-20 DIAGNOSIS — J4540 Moderate persistent asthma, uncomplicated: Secondary | ICD-10-CM

## 2022-10-20 DIAGNOSIS — L2089 Other atopic dermatitis: Secondary | ICD-10-CM | POA: Diagnosis not present

## 2022-10-20 DIAGNOSIS — J3089 Other allergic rhinitis: Secondary | ICD-10-CM

## 2022-10-20 MED ORDER — EUCRISA 2 % EX OINT: 60.0000 g | TOPICAL_OINTMENT | Freq: Two times a day (BID) | CUTANEOUS | 11 refills | Status: DC

## 2022-10-20 MED ORDER — MOMETASONE FUROATE 0.1 % EX CREA: 1.0000 | TOPICAL_CREAM | CUTANEOUS | 11 refills | Status: DC | PRN

## 2022-10-20 MED ORDER — FLUOCINOLONE ACETONIDE SCALP 0.01 % EX OIL: TOPICAL_OIL | CUTANEOUS | 11 refills | Status: AC

## 2022-10-20 MED ORDER — EPINEPHRINE 0.3 MG/0.3ML IJ SOAJ: 0.3000 mg | INTRAMUSCULAR | 1 refills | Status: DC | PRN

## 2022-10-20 MED ORDER — DULERA 100-5 MCG/ACT IN AERO: 2.0000 | INHALATION_SPRAY | Freq: Two times a day (BID) | RESPIRATORY_TRACT | 11 refills | Status: DC

## 2022-10-20 MED ORDER — ALBUTEROL SULFATE HFA 108 (90 BASE) MCG/ACT IN AERS: 2.0000 | INHALATION_SPRAY | RESPIRATORY_TRACT | 1 refills | Status: DC | PRN

## 2022-10-20 NOTE — Telephone Encounter (Signed)
Call was in regards to spacer that Dr. Delorse Lek wanted patient to have but did not remember until patient left the office. Advised patient to call the office back if he is still in the area and can swing back by to pick it up. If he can't please get his local pharmacy that we can send the prescription to because unsure if spacer can be sent to [facility] (mail order pharmacy)

## 2022-10-20 NOTE — Patient Instructions (Addendum)
Asthma  Continue Dulera 2 puffs twice a day with spacer.   Continue albuterol HFA use every 4-6 hours as needed for cough/wheeze/shortness of breath/chest tightness and 15 minutes prior to vigorous exercise. Let us know if you are not meeting the below asthma control goals:  Full participation in all desired activities (may need albuterol before activity) Albuterol use two time or less a week on average (not counting use with activity) Cough interfering with sleep two time or less a month Oral steroids no more than once a year No hospitalizations     Food allergy Continue meticulous avoidance of tree nuts and have access to epinephrine autoinjector 2 pack.   Eczema Continue appropriate skin care with mometasone 0.1% cream sparingly to affected areas as needed   Continue Eucrisa (a non-steroidal eczema cream) to use either alone or together with your steroid creams above.  Use as needed twice a day during eczema flares.   Daily moisturization We did discuss the new eczema therapies available if topical therapies become less effective which includes Dupixent (as injectable) and Rinvoq or Cibinqo (as oral pill therapies)   Allergic rhinitis  Allegra 180 mg or Xyzal 5 mg daily as needed At this time does not warrant immunotherapy (allergy shots).  If you have symptoms and are requiring allergy medications then may benefit from allergy shots.  Would recommend repeat testing (can be bloodwork) prior to initiating allergy shots if needed  Follow-up in 12 months or sooner if needed.  Asthma  - well controlled Continue Dulera 2 puffs twice a day with spacer.   Continue albuterol HFA use every 4-6 hours as needed for cough/wheeze/shortness of breath/chest tightness and 15 minutes prior to vigorous exercise. Let us know if you are not meeting the below asthma control goals:  Full participation in all desired activities (may need albuterol before activity) Albuterol use two time or  less a week on average (not counting use with activity) Cough interfering with sleep two time or less a month Oral steroids no more than once a year No hospitalizations     Food allergy Continue meticulous avoidance of tree nuts and have access to epinephrine autoinjector 2 pack.   Eczema Continue appropriate skin care with mometasone 0.1% cream sparingly to affected areas as needed   Continue Eucrisa (a non-steroidal eczema cream) to use either alone or together with your steroid creams above.  Use as needed twice a day during eczema flares.  For scalp flares use Dermasmoothe oil as needed  Daily moisturization We did discuss the new eczema therapies available if topical therapies become less effective which includes Dupixent (as injectable) and Rinvoq or Cibinqo (as oral pill therapies)   Allergic rhinitis  Allegra 180 mg or Xyzal 5 mg daily as needed At this time does not warrant immunotherapy (allergy shots).  If you have symptoms and are requiring allergy medications then may benefit from allergy shots.  Would recommend repeat testing (can be bloodwork) prior to initiating allergy shots if needed  Follow-up in 12 months or sooner if needed.

## 2022-10-20 NOTE — Progress Notes (Unsigned)
Follow-up Note  RE: Jon GladdenSamuel T Daniel MRN: [id number] DOB: [birth year] Date of Office Visit: 10/20/2022   History of present illness: Jon Daniel is a 32 y.o. male presenting today for follow-up of asthma, food allergy, eczema and allergic rhinitis.  He was last seen in the office on 10/20/2021 by myself.  He states he has had a good year however he has been dealing with his mother with alzheimer's which has been tough.   He states he has had some respiratory symptoms over the past 2 weeks.  States he has had flu-like symptoms and felt hot and has had cough and some chest pains.  He has used albuterol several times over the past 2 weeks due to symptoms.  He feels like symptoms have improved fortunately.  He continues on Dulera 2 puffs twice a day that he uses with spacer device.  He has not had any ED or urgent care visits or any systemic steroid needs since his last visit. With his eczema he states it is manageable.  At last visit he did not request a refill of the scalp cooling off and states that was likely a mistake as he has had more issues with a scalp dermatitis over the past year.  He states he was trying to use his appointments but it is not the same as being able to use of oil in the scalp.  He would like a refill of the Derma-Smoothe at this time.  For the body he states he will use the [place] mostly on his face for eczema control and will use mometasone pretty infrequently on the body.  He does moisturize after bathing. He has continued to avoid tree nuts without any accidental ingestions or need to use his epinephrine device which he does carry with him. He states he has noted more sneezing attacks this pollen season.  Not enough however to take antihistamines but states if symptoms worsen will take an antihistamine.  He states they has not yet gotten a puppy but plan on getting one this summer.  He will see how things go with his allergy symptoms before doing therapy like allergy shots.    Review of systems: Review of Systems  Constitutional: Negative.   HENT:         See HPI  Eyes: Negative.   Respiratory:         See HPI  Cardiovascular: Negative.   Musculoskeletal: Negative.   Skin: Negative.   Allergic/Immunologic: Negative.   Neurological: Negative.      All other systems negative unless noted above in HPI  Past medical/social/surgical/family history have been reviewed and are unchanged unless specifically indicated below.  No changes  Medication List: Current Outpatient Medications  Medication Sig Dispense Refill   DiphenhydrAMINE HCl (BENADRYL ALLERGY PO) Take by mouth as needed.     EPINEPHrine (EPIPEN 2-PAK) 0.3 mg/0.3 mL IJ SOAJ injection Inject 0.3 mg into the muscle as needed for anaphylaxis. 2 each 1   albuterol (PROVENTIL HFA) 108 (90 Base) MCG/ACT inhaler Inhale 2 puffs into the lungs every 4 (four) hours as needed. 54 g 1   EUCRISA 2 % OINT Apply 60 g topically 2 (two) times daily. Apply twice a day as needed for eczema flares 100 g 11   Fluocinolone Acetonide Scalp (DERMA-SMOOTHE/FS SCALP) 0.01 % OIL Apply to wet or dampened scalp, cover with a shower cap and leave on for at least 4 hours then wash hair thoroughly. You may use shampoo to  wash your hair afterwards. 118.28 mL 11   mometasone (ELOCON) 0.1 % cream Apply 1 Application topically as needed. 50 g 11   mometasone-formoterol (DULERA) 100-5 MCG/ACT AERO Inhale 2 puffs into the lungs 2 (two) times daily. 13 g 11   No current facility-administered medications for this visit.     Known medication allergies: No Known Allergies   Physical examination: Blood pressure (!) 142/98, pulse 84, temperature 98.7 F (37.1 C), temperature source Temporal, resp. rate 16, weight 196 lb (88.9 kg), SpO2 100 %.  General: Alert, interactive, in no acute distress. HEENT: PERRLA, TMs pearly gray, turbinates non-edematous without discharge, post-pharynx non erythematous. Neck: Supple without  lymphadenopathy. Lungs: Clear to auscultation without wheezing, rhonchi or rales. {no increased work of breathing. CV: Normal S1, S2 without murmurs. Abdomen: Nondistended, nontender. Skin: Warm and dry, without lesions or rashes. Extremities:  No clubbing, cyanosis or edema. Neuro:   Grossly intact.  Diagnositics/Labs:  Spirometry: FEV1: 4.63L 106%, FVC: 6.13L 116%, ratio consistent with nonbostructive pattern  Assessment and plan:   Asthma  Continue Dulera 100mcg 2 puffs twice a day with spacer.   Continue albuterol HFA use every 4-6 hours as needed for cough/wheeze/shortness of breath/chest tightness and 15 minutes prior to vigorous exercise. Let us know if you are not meeting the below asthma control goals:  Full participation in all desired activities (may need albuterol before activity) Albuterol use two time or less a week on average (not counting use with activity) Cough interfering with sleep two time or less a month Oral steroids no more than once a year No hospitalizations     Food allergy Continue meticulous avoidance of tree nuts and have access to epinephrine autoinjector 2 pack.   Eczema Continue appropriate skin care with mometasone 0.1% cream sparingly to affected areas as needed   Continue Eucrisa (a non-steroidal eczema cream) to use either alone or together with your steroid creams above.  Use as needed twice a day during eczema flares.   Daily moisturization We did discuss the new eczema therapies available if topical therapies become less effective which includes Dupixent (as injectable) and Rinvoq or Cibinqo (as oral pill therapies)   Allergic rhinitis  Allegra 180 mg or Xyzal 5 mg daily as needed At this time does not warrant immunotherapy (allergy shots).  If you have symptoms and are requiring allergy medications then may benefit from allergy shots.  Would recommend repeat testing (can be bloodwork) prior to initiating allergy shots if needed  Follow-up  in 12 months or sooner if needed.  Asthma  - well controlled Continue Dulera 100mcg 2 puffs twice a day with spacer.   Continue albuterol HFA use every 4-6 hours as needed for cough/wheeze/shortness of breath/chest tightness and 15 minutes prior to vigorous exercise. Let us know if you are not meeting the below asthma control goals:  Full participation in all desired activities (may need albuterol before activity) Albuterol use two time or less a week on average (not counting use with activity) Cough interfering with sleep two time or less a month Oral steroids no more than once a year No hospitalizations     Food allergy Continue meticulous avoidance of tree nuts and have access to epinephrine autoinjector 2 pack.   Eczema Continue appropriate skin care with mometasone 0.1% cream sparingly to affected areas as needed   Continue Eucrisa (a non-steroidal eczema cream) to use either alone or together with your steroid creams above.  Use as needed twice a day during  eczema flares.  For scalp flares use Dermasmoothe oil as needed  Daily moisturization We did discuss the new eczema therapies available if topical therapies become less effective which includes Dupixent (as injectable) and Rinvoq or Cibinqo (as oral pill therapies)   Allergic rhinitis  Allegra 180 mg or Xyzal 5 mg daily as needed At this time does not warrant immunotherapy (allergy shots).  If you have symptoms and are requiring allergy medications then may benefit from allergy shots.  Would recommend repeat testing (can be bloodwork) prior to initiating allergy shots if needed  Follow-up in 12 months or sooner if needed.   I appreciate the opportunity to take part in [place]'s care. Please do not hesitate to contact me with questions.  Sincerely,   Margo Aye, MD Allergy/Immunology [facility]

## 2022-10-23 ENCOUNTER — Other Ambulatory Visit: Payer: Self-pay | Admitting: Allergy

## 2022-10-24 ENCOUNTER — Other Ambulatory Visit: Payer: Self-pay | Admitting: *Deleted

## 2022-10-24 MED ORDER — EUCRISA 2 % EX OINT: 60.0000 g | TOPICAL_OINTMENT | Freq: Two times a day (BID) | CUTANEOUS | 3 refills | Status: DC

## 2022-10-26 MED ORDER — DULERA 100-5 MCG/ACT IN AERO: 2.0000 | INHALATION_SPRAY | Freq: Two times a day (BID) | RESPIRATORY_TRACT | 11 refills | Status: DC

## 2022-10-26 MED ORDER — ALBUTEROL SULFATE HFA 108 (90 BASE) MCG/ACT IN AERS: 2.0000 | INHALATION_SPRAY | RESPIRATORY_TRACT | 1 refills | Status: DC | PRN

## 2022-10-26 NOTE — Addendum Note (Signed)
Addended by: Alfonse Spruce on: 10/26/2022 08:38 AM   Modules accepted: Orders

## 2022-10-26 NOTE — Telephone Encounter (Signed)
Patient called and reported that [facility] is being a pain regarding sending his Dulera and albuterol. We sent to a local pharmacy instead.   Malachi Bonds, MD [facility]

## 2023-10-26 ENCOUNTER — Ambulatory Visit: Payer: 59 | Admitting: Allergy

## 2023-12-28 ENCOUNTER — Ambulatory Visit: Admitting: Allergy

## 2023-12-28 ENCOUNTER — Encounter: Payer: Self-pay | Admitting: Allergy

## 2023-12-28 ENCOUNTER — Other Ambulatory Visit: Payer: Self-pay

## 2023-12-28 VITALS — BP 122/74 | HR 74 | Temp 98.0°F | Resp 18 | Ht 69.0 in | Wt 180.7 lb

## 2023-12-28 DIAGNOSIS — J3089 Other allergic rhinitis: Secondary | ICD-10-CM

## 2023-12-28 DIAGNOSIS — T7800XD Anaphylactic reaction due to unspecified food, subsequent encounter: Secondary | ICD-10-CM

## 2023-12-28 DIAGNOSIS — L2089 Other atopic dermatitis: Secondary | ICD-10-CM | POA: Diagnosis not present

## 2023-12-28 DIAGNOSIS — J4540 Moderate persistent asthma, uncomplicated: Secondary | ICD-10-CM

## 2023-12-28 MED ORDER — ALBUTEROL SULFATE HFA 108 (90 BASE) MCG/ACT IN AERS: 2.0000 | INHALATION_SPRAY | RESPIRATORY_TRACT | 1 refills | Status: DC | PRN

## 2023-12-28 MED ORDER — NEFFY 2 MG/0.1ML NA SOLN: 1.0000 | NASAL | 1 refills | Status: AC | PRN

## 2023-12-28 MED ORDER — EUCRISA 2 % EX OINT: 60.0000 g | TOPICAL_OINTMENT | Freq: Two times a day (BID) | CUTANEOUS | 3 refills | Status: DC

## 2023-12-28 MED ORDER — MOMETASONE FUROATE 0.1 % EX CREA: 1.0000 | TOPICAL_CREAM | CUTANEOUS | 11 refills | Status: AC | PRN

## 2023-12-28 MED ORDER — DULERA 100-5 MCG/ACT IN AERO: 2.0000 | INHALATION_SPRAY | Freq: Two times a day (BID) | RESPIRATORY_TRACT | 11 refills | Status: AC

## 2023-12-28 MED ORDER — EPINEPHRINE 0.3 MG/0.3ML IJ SOAJ: 0.3000 mg | INTRAMUSCULAR | 1 refills | Status: AC | PRN

## 2023-12-28 NOTE — Patient Instructions (Addendum)
 Asthma  Will try to wean down to Dulera  100mcg 1 puffs twice a day with spacer.   Advised if he notes increase in symptoms or albuterol  needed then go back up to 2 puffs twice a day. Continue albuterol  HFA use every 4-6 hours as needed for cough/wheeze/shortness of breath/chest tightness and 15 minutes prior to vigorous exercise. Let us  know if you are not meeting the below asthma control goals:  Full participation in all desired activities (may need albuterol  before activity) Albuterol  use two time or less a week on average (not counting use with activity) Cough interfering with sleep two time or less a month Oral steroids no more than once a year No hospitalizations     Food allergy Continue meticulous avoidance of tree nuts and have access to epinephrine  autoinjector 2 pack or Neffy  device.  Discussed today the Neffy , nasal spray device, as an alternative to the EpiPen  device to use for allergic reaction.  Will send this device to the specialty pharmacy, [facility] in [place], [place] and they will contact you regarding this prescription.  He will get mailed to you once you approve.   Eczema Continue appropriate skin care with mometasone  0.1% cream sparingly to affected areas as needed   Continue Eucrisa  (a non-steroidal eczema cream) to use either alone or together with your steroid creams above.  Use as needed twice a day during eczema flares.   Daily moisturization We have discussed new eczema therapies available if topical therapies become less effective which includes Dupixent/Adbry/Ebglyss/Nemulivio (as injectable options) and Rinvoq/Cibinqo (as oral pill therapies).   Allergic rhinitis  Allegra 180 mg or Xyzal  5 mg daily as needed At this time does not warrant immunotherapy (allergy shots).  If you have symptoms and are requiring allergy medications then may benefit from allergy shots.  Would recommend repeat testing (can be bloodwork) prior to initiating allergy shots if needed  Follow-up  in 12 months or sooner if needed.

## 2023-12-28 NOTE — Progress Notes (Signed)
 Follow-up Note  RE: COPELAN MAULTSBY MRN: [id number] DOB: [birth year] Date of Office Visit: 12/28/2023   History of present illness: AJAHNI NAY is a 33 y.o. male presenting today for follow-up of asthma, atopic dermatitis, food allergy, allergic rhinitis.  He was last seen in the office on 10/20/2022 by myself. Discussed the use of AI scribe software for clinical note transcription with the patient, who gave verbal consent to proceed.  He has been actively managing his asthma and notes significant improvement since he began running regularly around September or October of last year. Initially, he used his rescue inhaler, two puffs before running, but since January, he has only needed it once during a run and no longer starts with it. He has not needed the rescue inhaler outside of running, except for a few times this year. He continues to carry it with him. He is currently using Dulera , two puffs twice a day.  He experienced a sinus infection in mid-May for which he received antibiotics. He describes this as his 'yearly sinus infection' and has not had any other respiratory illnesses this winter season.  He has lost about twenty pounds and aims to lose fifteen more. He has been eating healthier, including more salads, and has been on a health kick, drinking a lot of water. He mentions missing foods like fried chicken.  He has not had to use his EpiPen  device but wants to stay up to date with it. He keeps it in his book bag for emergencies.  He continues to avoid tree nuts.  His skin has been good, and he has been managing it with his cream, Eucrisa  and mometasone . He experienced a little stress-related flare-up recently but has been able to control it with his cream.  He has been managing his allergies with Allegra, which helps when he starts feeling symptoms. He has experienced some congestion in the mornings but it clears up by late morning. He has been careful to avoid high pollen days  and runs in the evening when pollen levels are lower.     Overall he states he is doing very well.  Review of systems: 10pt ROS negative unless noted above in HPI  Past medical/social/surgical/family history have been reviewed and are unchanged unless specifically indicated below.  No changes  Medication List: Current Outpatient Medications  Medication Sig Dispense Refill   albuterol  (PROVENTIL  HFA) 108 (90 Base) MCG/ACT inhaler Inhale 2 puffs into the lungs every 4 (four) hours as needed. 54 g 1   DiphenhydrAMINE HCl (BENADRYL ALLERGY PO) Take by mouth as needed.     EPINEPHrine  (EPIPEN  2-PAK) 0.3 mg/0.3 mL IJ SOAJ injection Inject 0.3 mg into the muscle as needed for anaphylaxis. 2 each 1   EUCRISA  2 % OINT Apply 60 g topically 2 (two) times daily. Apply twice a day as needed for eczema flares 300 g 3   Fluocinolone  Acetonide Scalp (DERMA-SMOOTHE /FS SCALP) 0.01 % OIL Apply to wet or dampened scalp, cover with a shower cap and leave on for at least 4 hours then wash hair thoroughly. You may use shampoo to wash your hair afterwards. 118.28 mL 11   mometasone  (ELOCON ) 0.1 % cream Apply 1 Application topically as needed. 50 g 11   mometasone -formoterol  (DULERA ) 100-5 MCG/ACT AERO Inhale 2 puffs into the lungs 2 (two) times daily. 13 g 11   No current facility-administered medications for this visit.     Known medication allergies: No Known Allergies   Physical examination:  Blood pressure 122/74, pulse 74, temperature 98 F (36.7 C), temperature source Temporal, resp. rate 18, height 5' 9 (1.753 m), weight 180 lb 11.2 oz (82 kg), SpO2 98%.  General: Alert, interactive, in no acute distress. HEENT: PERRLA, TMs pearly gray, turbinates non-edematous without discharge, post-pharynx non erythematous. Neck: Supple without lymphadenopathy. Lungs: Clear to auscultation without wheezing, rhonchi or rales. {no increased work of breathing. CV: Normal S1, S2 without murmurs. Abdomen:  Nondistended, nontender. Skin: Warm and dry, without lesions or rashes. Extremities:  No clubbing, cyanosis or edema. Neuro:   Grossly intact.  Diagnostics/Labs: None today   Assessment and plan: Asthma  Will try to wean down to Dulera  100mcg 1 puffs twice a day with spacer.   Advised if he notes increase in symptoms or albuterol  needed then go back up to 2 puffs twice a day. Continue albuterol  HFA use every 4-6 hours as needed for cough/wheeze/shortness of breath/chest tightness and 15 minutes prior to vigorous exercise. Let us  know if you are not meeting the below asthma control goals:  Full participation in all desired activities (may need albuterol  before activity) Albuterol  use two time or less a week on average (not counting use with activity) Cough interfering with sleep two time or less a month Oral steroids no more than once a year No hospitalizations     Food allergy Continue meticulous avoidance of tree nuts and have access to epinephrine  autoinjector 2 pack or Neffy  device.  Discussed today the Neffy , nasal spray device, as an alternative to the EpiPen  device to use for allergic reaction.  Will send this device to the specialty pharmacy, [facility] in [place], [place] and they will contact you regarding this prescription.  He will get mailed to you once you approve.   Eczema Continue appropriate skin care with mometasone  0.1% cream sparingly to affected areas as needed   Continue Eucrisa  (a non-steroidal eczema cream) to use either alone or together with your steroid creams above.  Use as needed twice a day during eczema flares.   Daily moisturization We have discussed new eczema therapies available if topical therapies become less effective which includes Dupixent/Adbry/Ebglyss/Nemulivio (as injectable options) and Rinvoq/Cibinqo (as oral pill therapies).   Allergic rhinitis  Allegra 180 mg or Xyzal  5 mg daily as needed At this time does not warrant immunotherapy (allergy shots).   If you have symptoms and are requiring allergy medications then may benefit from allergy shots.  Would recommend repeat testing (can be bloodwork) prior to initiating allergy shots if needed  Follow-up in 12 months or sooner if needed.  I appreciate the opportunity to take part in [place]'s care. Please do not hesitate to contact me with questions.  Sincerely,   Catha Clink, MD Allergy/Immunology [facility]

## 2024-01-11 ENCOUNTER — Telehealth: Payer: Self-pay | Admitting: Allergy

## 2024-01-11 NOTE — Telephone Encounter (Signed)
 PT called to advise needed Prior Auth for Eucrisa , and that [facility] declined to fill Albuterol  order due to that/generic not being covered by insurance - PT is hoping for updated RX to be sent to [facility] and update from Clinical when available

## 2024-01-15 ENCOUNTER — Other Ambulatory Visit (HOSPITAL_COMMUNITY): Payer: Self-pay

## 2024-01-15 NOTE — Telephone Encounter (Signed)
 According to a test claim, this was filled 01-03-2024 and insurance is showing next fill available on 01-27-2024

## 2024-01-16 NOTE — Telephone Encounter (Signed)
 Patient was instructed to call pharmacy and insurance to see which inhaler is on his formulary for us  to send in to pharmacy.

## 2024-01-21 ENCOUNTER — Telehealth: Payer: Self-pay

## 2024-01-21 ENCOUNTER — Other Ambulatory Visit (HOSPITAL_COMMUNITY): Payer: Self-pay

## 2024-01-21 NOTE — Telephone Encounter (Signed)
*  AA  Pharmacy Patient Advocate Encounter   Received notification from Pt Calls Messages that prior authorization for Eucrisa   is required/requested.   Insurance verification completed.   The patient is insured through [employer] .   Per test claim: Refill too soon. PA is not needed at this time. Medication was filled 06/19. Next eligible fill date is 07/11.

## 2024-01-21 NOTE — Telephone Encounter (Signed)
 PA request has been Received. New Encounter has been or will be created for follow up. For additional info see Pharmacy Prior Auth telephone encounter from 07/07.

## 2024-01-21 NOTE — Telephone Encounter (Signed)
*  AA  Pharmacy Patient Advocate Encounter   Received notification from Pt Calls Messages that prior authorization for Albuterol  HFA is required/requested.   Insurance verification completed.   The patient is insured through [employer] .   Per test claim:  Albuterol  HFA 8.5gm is preferred by the insurance.  If suggested medication is appropriate, Please send in a new RX and discontinue this one. If not, please advise as to why it's not appropriate so that we may request a Prior Authorization. Please note, some preferred medications may still require a PA.  If the suggested medications have not been trialed and there are no contraindications to their use, the PA will not be submitted, as it will not be approved.

## 2024-01-22 MED ORDER — ALBUTEROL SULFATE HFA 108 (90 BASE) MCG/ACT IN AERS: 2.0000 | INHALATION_SPRAY | RESPIRATORY_TRACT | 1 refills | Status: AC | PRN

## 2024-01-22 NOTE — Telephone Encounter (Signed)
 Albuterol  8g has been sent into the pharmacy. I called the patient and left a message to call the office back to inform.

## 2024-01-22 NOTE — Addendum Note (Signed)
 Addended by: MENDEZ-MUNGARAY, Izumi Mixon M on: 01/22/2024 05:01 PM   Modules accepted: Orders

## 2024-01-25 ENCOUNTER — Other Ambulatory Visit: Payer: Self-pay | Admitting: *Deleted

## 2024-01-25 MED ORDER — EUCRISA 2 % EX OINT: 1.0000 | TOPICAL_OINTMENT | Freq: Two times a day (BID) | CUTANEOUS | 2 refills | Status: AC | PRN

## 2024-01-25 NOTE — Telephone Encounter (Signed)
 Patient has been notified that Eucrisa  should be ready to be refilled today 01/25/24 via voicemail.

## 2025-01-02 ENCOUNTER — Ambulatory Visit: Admitting: Allergy
